# Patient Record
Sex: Female | Born: 1935 | Race: Black or African American | Hispanic: No | Smoking: Never smoker
Health system: Southern US, Community
[De-identification: ages and names within clinical notes are randomized; demographics above are authoritative.]

## PROBLEM LIST (undated history)

## (undated) DIAGNOSIS — E559 Vitamin D deficiency, unspecified: Secondary | ICD-10-CM

## (undated) DIAGNOSIS — K219 Gastro-esophageal reflux disease without esophagitis: Secondary | ICD-10-CM

## (undated) DIAGNOSIS — R51 Headache: Secondary | ICD-10-CM

## (undated) DIAGNOSIS — E876 Hypokalemia: Secondary | ICD-10-CM

## (undated) DIAGNOSIS — G301 Alzheimer's disease with late onset: Secondary | ICD-10-CM

## (undated) DIAGNOSIS — R519 Headache, unspecified: Secondary | ICD-10-CM

## (undated) DIAGNOSIS — C259 Malignant neoplasm of pancreas, unspecified: Principal | ICD-10-CM

## (undated) DIAGNOSIS — E785 Hyperlipidemia, unspecified: Secondary | ICD-10-CM

## (undated) DIAGNOSIS — F0281 Dementia in other diseases classified elsewhere with behavioral disturbance: Secondary | ICD-10-CM

## (undated) DIAGNOSIS — R413 Other amnesia: Secondary | ICD-10-CM

## (undated) DIAGNOSIS — C772 Secondary and unspecified malignant neoplasm of intra-abdominal lymph nodes: Principal | ICD-10-CM

## (undated) DIAGNOSIS — I10 Essential (primary) hypertension: Secondary | ICD-10-CM

## (undated) HISTORY — DX: Hyperlipidemia, unspecified: E78.5

## (undated) HISTORY — DX: Dementia in other diseases classified elsewhere with behavioral disturbance: F02.81

## (undated) HISTORY — DX: Headache, unspecified: R51.9

## (undated) HISTORY — DX: Headache: R51

## (undated) HISTORY — DX: Gastro-esophageal reflux disease without esophagitis: K21.9

## (undated) HISTORY — PX: APPENDECTOMY: SHX54

## (undated) HISTORY — DX: Other amnesia: R41.3

## (undated) HISTORY — DX: Vitamin D deficiency, unspecified: E55.9

## (undated) HISTORY — DX: Malignant neoplasm of pancreas, unspecified: C25.9

## (undated) HISTORY — DX: Alzheimer's disease with late onset: G30.1

## (undated) HISTORY — DX: Essential (primary) hypertension: I10

## (undated) HISTORY — DX: Hypokalemia: E87.6

## (undated) HISTORY — DX: Secondary and unspecified malignant neoplasm of intra-abdominal lymph nodes: C77.2

---

## 2013-01-25 ENCOUNTER — Telehealth: Payer: Self-pay

## 2013-01-25 NOTE — Telephone Encounter (Signed)
Patient's daughter states that she will bring all the information tomorrow. Has med records from previous provider. Needs DPR for daughter

## 2013-01-26 ENCOUNTER — Encounter: Payer: Self-pay | Admitting: Family Medicine

## 2013-01-26 ENCOUNTER — Ambulatory Visit: Payer: Self-pay | Admitting: Family Medicine

## 2013-01-26 ENCOUNTER — Ambulatory Visit (INDEPENDENT_AMBULATORY_CARE_PROVIDER_SITE_OTHER): Payer: Medicare Other | Admitting: Family Medicine

## 2013-01-26 VITALS — BP 122/60 | HR 74 | Temp 99.0°F | Ht 61.0 in | Wt 137.2 lb

## 2013-01-26 DIAGNOSIS — Z0000 Encounter for general adult medical examination without abnormal findings: Secondary | ICD-10-CM

## 2013-01-26 DIAGNOSIS — E785 Hyperlipidemia, unspecified: Secondary | ICD-10-CM

## 2013-01-26 DIAGNOSIS — R413 Other amnesia: Secondary | ICD-10-CM

## 2013-01-26 DIAGNOSIS — Z23 Encounter for immunization: Secondary | ICD-10-CM

## 2013-01-26 DIAGNOSIS — I10 Essential (primary) hypertension: Secondary | ICD-10-CM

## 2013-01-26 MED ORDER — ZOSTER VACCINE LIVE 19400 UNT/0.65ML ~~LOC~~ SOLR: 0.6500 mL | Freq: Once | SUBCUTANEOUS | Status: DC

## 2013-01-26 NOTE — Patient Instructions (Signed)
Preventive Care for Adults, Female A healthy lifestyle and preventive care can promote health and wellness. Preventive health guidelines for women include the following key practices.  A routine yearly physical is a good way to check with your caregiver about your health and preventive screening. It is a chance to share any concerns and updates on your health, and to receive a thorough exam.  Visit your dentist for a routine exam and preventive care every 6 months. Brush your teeth twice a day and floss once a day. Good oral hygiene prevents tooth decay and gum disease.  The frequency of eye exams is based on your age, health, family medical history, use of contact lenses, and other factors. Follow your caregiver's recommendations for frequency of eye exams.  Eat a healthy diet. Foods like vegetables, fruits, whole grains, low-fat dairy products, and lean protein foods contain the nutrients you need without too many calories. Decrease your intake of foods high in solid fats, added sugars, and salt. Eat the right amount of calories for you.Get information about a proper diet from your caregiver, if necessary.  Regular physical exercise is one of the most important things you can do for your health. Most adults should get at least 150 minutes of moderate-intensity exercise (any activity that increases your heart rate and causes you to sweat) each week. In addition, most adults need muscle-strengthening exercises on 2 or more days a week.  Maintain a healthy weight. The body mass index (BMI) is a screening tool to identify possible weight problems. It provides an estimate of body fat based on height and weight. Your caregiver can help determine your BMI, and can help you achieve or maintain a healthy weight.For adults 20 years and older:  A BMI below 18.5 is considered underweight.  A BMI of 18.5 to 24.9 is normal.  A BMI of 25 to 29.9 is considered overweight.  A BMI of 30 and above is  considered obese.  Maintain normal blood lipids and cholesterol levels by exercising and minimizing your intake of saturated fat. Eat a balanced diet with plenty of fruit and vegetables. Blood tests for lipids and cholesterol should begin at age 20 and be repeated every 5 years. If your lipid or cholesterol levels are high, you are over 50, or you are at high risk for heart disease, you may need your cholesterol levels checked more frequently.Ongoing high lipid and cholesterol levels should be treated with medicines if diet and exercise are not effective.  If you smoke, find out from your caregiver how to quit. If you do not use tobacco, do not start.  If you are pregnant, do not drink alcohol. If you are breastfeeding, be very cautious about drinking alcohol. If you are not pregnant and choose to drink alcohol, do not exceed 1 drink per day. One drink is considered to be 12 ounces (355 mL) of beer, 5 ounces (148 mL) of wine, or 1.5 ounces (44 mL) of liquor.  Avoid use of street drugs. Do not share needles with anyone. Ask for help if you need support or instructions about stopping the use of drugs.  High blood pressure causes heart disease and increases the risk of stroke. Your blood pressure should be checked at least every 1 to 2 years. Ongoing high blood pressure should be treated with medicines if weight loss and exercise are not effective.  If you are 55 to 77 years old, ask your caregiver if you should take aspirin to prevent strokes.  Diabetes   screening involves taking a blood sample to check your fasting blood sugar level. This should be done once every 3 years, after age 45, if you are within normal weight and without risk factors for diabetes. Testing should be considered at a younger age or be carried out more frequently if you are overweight and have at least 1 risk factor for diabetes.  Breast cancer screening is essential preventive care for women. You should practice "breast  self-awareness." This means understanding the normal appearance and feel of your breasts and may include breast self-examination. Any changes detected, no matter how small, should be reported to a caregiver. Women in their 20s and 30s should have a clinical breast exam (CBE) by a caregiver as part of a regular health exam every 1 to 3 years. After age 40, women should have a CBE every year. Starting at age 40, women should consider having a mammography (breast X-ray test) every year. Women who have a family history of breast cancer should talk to their caregiver about genetic screening. Women at a high risk of breast cancer should talk to their caregivers about having magnetic resonance imaging (MRI) and a mammography every year.  The Pap test is a screening test for cervical cancer. A Pap test can show cell changes on the cervix that might become cervical cancer if left untreated. A Pap test is a procedure in which cells are obtained and examined from the lower end of the uterus (cervix).  Women should have a Pap test starting at age 21.  Between ages 21 and 29, Pap tests should be repeated every 2 years.  Beginning at age 30, you should have a Pap test every 3 years as long as the past 3 Pap tests have been normal.  Some women have medical problems that increase the chance of getting cervical cancer. Talk to your caregiver about these problems. It is especially important to talk to your caregiver if a new problem develops soon after your last Pap test. In these cases, your caregiver may recommend more frequent screening and Pap tests.  The above recommendations are the same for women who have or have not gotten the vaccine for human papillomavirus (HPV).  If you had a hysterectomy for a problem that was not cancer or a condition that could lead to cancer, then you no longer need Pap tests. Even if you no longer need a Pap test, a regular exam is a good idea to make sure no other problems are  starting.  If you are between ages 65 and 70, and you have had normal Pap tests going back 10 years, you no longer need Pap tests. Even if you no longer need a Pap test, a regular exam is a good idea to make sure no other problems are starting.  If you have had past treatment for cervical cancer or a condition that could lead to cancer, you need Pap tests and screening for cancer for at least 20 years after your treatment.  If Pap tests have been discontinued, risk factors (such as a new sexual partner) need to be reassessed to determine if screening should be resumed.  The HPV test is an additional test that may be used for cervical cancer screening. The HPV test looks for the virus that can cause the cell changes on the cervix. The cells collected during the Pap test can be tested for HPV. The HPV test could be used to screen women aged 30 years and older, and should   be used in women of any age who have unclear Pap test results. After the age of 30, women should have HPV testing at the same frequency as a Pap test.  Colorectal cancer can be detected and often prevented. Most routine colorectal cancer screening begins at the age of 50 and continues through age 75. However, your caregiver may recommend screening at an earlier age if you have risk factors for colon cancer. On a yearly basis, your caregiver may provide home test kits to check for hidden blood in the stool. Use of a small camera at the end of a tube, to directly examine the colon (sigmoidoscopy or colonoscopy), can detect the earliest forms of colorectal cancer. Talk to your caregiver about this at age 50, when routine screening begins. Direct examination of the colon should be repeated every 5 to 10 years through age 75, unless early forms of pre-cancerous polyps or small growths are found.  Hepatitis C blood testing is recommended for all people born from 1945 through 1965 and any individual with known risks for hepatitis C.  Practice  safe sex. Use condoms and avoid high-risk sexual practices to reduce the spread of sexually transmitted infections (STIs). STIs include gonorrhea, chlamydia, syphilis, trichomonas, herpes, HPV, and human immunodeficiency virus (HIV). Herpes, HIV, and HPV are viral illnesses that have no cure. They can result in disability, cancer, and death. Sexually active women aged 25 and younger should be checked for chlamydia. Older women with new or multiple partners should also be tested for chlamydia. Testing for other STIs is recommended if you are sexually active and at increased risk.  Osteoporosis is a disease in which the bones lose minerals and strength with aging. This can result in serious bone fractures. The risk of osteoporosis can be identified using a bone density scan. Women ages 65 and over and women at risk for fractures or osteoporosis should discuss screening with their caregivers. Ask your caregiver whether you should take a calcium supplement or vitamin D to reduce the rate of osteoporosis.  Menopause can be associated with physical symptoms and risks. Hormone replacement therapy is available to decrease symptoms and risks. You should talk to your caregiver about whether hormone replacement therapy is right for you.  Use sunscreen with sun protection factor (SPF) of 30 or more. Apply sunscreen liberally and repeatedly throughout the day. You should seek shade when your shadow is shorter than you. Protect yourself by wearing long sleeves, pants, a wide-brimmed hat, and sunglasses year round, whenever you are outdoors.  Once a month, do a whole body skin exam, using a mirror to look at the skin on your back. Notify your caregiver of new moles, moles that have irregular borders, moles that are larger than a pencil eraser, or moles that have changed in shape or color.  Stay current with required immunizations.  Influenza. You need a dose every fall (or winter). The composition of the flu vaccine  changes each year, so being vaccinated once is not enough.  Pneumococcal polysaccharide. You need 1 to 2 doses if you smoke cigarettes or if you have certain chronic medical conditions. You need 1 dose at age 65 (or older) if you have never been vaccinated.  Tetanus, diphtheria, pertussis (Tdap, Td). Get 1 dose of Tdap vaccine if you are younger than age 65, are over 65 and have contact with an infant, are a healthcare worker, are pregnant, or simply want to be protected from whooping cough. After that, you need a Td   booster dose every 10 years. Consult your caregiver if you have not had at least 3 tetanus and diphtheria-containing shots sometime in your life or have a deep or dirty wound.  HPV. You need this vaccine if you are a woman age 26 or younger. The vaccine is given in 3 doses over 6 months.  Measles, mumps, rubella (MMR). You need at least 1 dose of MMR if you were born in 1957 or later. You may also need a second dose.  Meningococcal. If you are age 19 to 21 and a first-year college student living in a residence hall, or have one of several medical conditions, you need to get vaccinated against meningococcal disease. You may also need additional booster doses.  Zoster (shingles). If you are age 60 or older, you should get this vaccine.  Varicella (chickenpox). If you have never had chickenpox or you were vaccinated but received only 1 dose, talk to your caregiver to find out if you need this vaccine.  Hepatitis A. You need this vaccine if you have a specific risk factor for hepatitis A virus infection or you simply wish to be protected from this disease. The vaccine is usually given as 2 doses, 6 to 18 months apart.  Hepatitis B. You need this vaccine if you have a specific risk factor for hepatitis B virus infection or you simply wish to be protected from this disease. The vaccine is given in 3 doses, usually over 6 months. Preventive Services / Frequency Ages 19 to 39  Blood  pressure check.** / Every 1 to 2 years.  Lipid and cholesterol check.** / Every 5 years beginning at age 20.  Clinical breast exam.** / Every 3 years for women in their 20s and 30s.  Pap test.** / Every 2 years from ages 21 through 29. Every 3 years starting at age 30 through age 65 or 70 with a history of 3 consecutive normal Pap tests.  HPV screening.** / Every 3 years from ages 30 through ages 65 to 70 with a history of 3 consecutive normal Pap tests.  Hepatitis C blood test.** / For any individual with known risks for hepatitis C.  Skin self-exam. / Monthly.  Influenza immunization.** / Every year.  Pneumococcal polysaccharide immunization.** / 1 to 2 doses if you smoke cigarettes or if you have certain chronic medical conditions.  Tetanus, diphtheria, pertussis (Tdap, Td) immunization. / A one-time dose of Tdap vaccine. After that, you need a Td booster dose every 10 years.  HPV immunization. / 3 doses over 6 months, if you are 26 and younger.  Measles, mumps, rubella (MMR) immunization. / You need at least 1 dose of MMR if you were born in 1957 or later. You may also need a second dose.  Meningococcal immunization. / 1 dose if you are age 19 to 21 and a first-year college student living in a residence hall, or have one of several medical conditions, you need to get vaccinated against meningococcal disease. You may also need additional booster doses.  Varicella immunization.** / Consult your caregiver.  Hepatitis A immunization.** / Consult your caregiver. 2 doses, 6 to 18 months apart.  Hepatitis B immunization.** / Consult your caregiver. 3 doses usually over 6 months. Ages 40 to 64  Blood pressure check.** / Every 1 to 2 years.  Lipid and cholesterol check.** / Every 5 years beginning at age 20.  Clinical breast exam.** / Every year after age 40.  Mammogram.** / Every year beginning at age 40   and continuing for as long as you are in good health. Consult with your  caregiver.  Pap test.** / Every 3 years starting at age 30 through age 65 or 70 with a history of 3 consecutive normal Pap tests.  HPV screening.** / Every 3 years from ages 30 through ages 65 to 70 with a history of 3 consecutive normal Pap tests.  Fecal occult blood test (FOBT) of stool. / Every year beginning at age 50 and continuing until age 75. You may not need to do this test if you get a colonoscopy every 10 years.  Flexible sigmoidoscopy or colonoscopy.** / Every 5 years for a flexible sigmoidoscopy or every 10 years for a colonoscopy beginning at age 50 and continuing until age 75.  Hepatitis C blood test.** / For all people born from 1945 through 1965 and any individual with known risks for hepatitis C.  Skin self-exam. / Monthly.  Influenza immunization.** / Every year.  Pneumococcal polysaccharide immunization.** / 1 to 2 doses if you smoke cigarettes or if you have certain chronic medical conditions.  Tetanus, diphtheria, pertussis (Tdap, Td) immunization.** / A one-time dose of Tdap vaccine. After that, you need a Td booster dose every 10 years.  Measles, mumps, rubella (MMR) immunization. / You need at least 1 dose of MMR if you were born in 1957 or later. You may also need a second dose.  Varicella immunization.** / Consult your caregiver.  Meningococcal immunization.** / Consult your caregiver.  Hepatitis A immunization.** / Consult your caregiver. 2 doses, 6 to 18 months apart.  Hepatitis B immunization.** / Consult your caregiver. 3 doses, usually over 6 months. Ages 65 and over  Blood pressure check.** / Every 1 to 2 years.  Lipid and cholesterol check.** / Every 5 years beginning at age 20.  Clinical breast exam.** / Every year after age 40.  Mammogram.** / Every year beginning at age 40 and continuing for as long as you are in good health. Consult with your caregiver.  Pap test.** / Every 3 years starting at age 30 through age 65 or 70 with a 3  consecutive normal Pap tests. Testing can be stopped between 65 and 70 with 3 consecutive normal Pap tests and no abnormal Pap or HPV tests in the past 10 years.  HPV screening.** / Every 3 years from ages 30 through ages 65 or 70 with a history of 3 consecutive normal Pap tests. Testing can be stopped between 65 and 70 with 3 consecutive normal Pap tests and no abnormal Pap or HPV tests in the past 10 years.  Fecal occult blood test (FOBT) of stool. / Every year beginning at age 50 and continuing until age 75. You may not need to do this test if you get a colonoscopy every 10 years.  Flexible sigmoidoscopy or colonoscopy.** / Every 5 years for a flexible sigmoidoscopy or every 10 years for a colonoscopy beginning at age 50 and continuing until age 75.  Hepatitis C blood test.** / For all people born from 1945 through 1965 and any individual with known risks for hepatitis C.  Osteoporosis screening.** / A one-time screening for women ages 65 and over and women at risk for fractures or osteoporosis.  Skin self-exam. / Monthly.  Influenza immunization.** / Every year.  Pneumococcal polysaccharide immunization.** / 1 dose at age 65 (or older) if you have never been vaccinated.  Tetanus, diphtheria, pertussis (Tdap, Td) immunization. / A one-time dose of Tdap vaccine if you are over   65 and have contact with an infant, are a healthcare worker, or simply want to be protected from whooping cough. After that, you need a Td booster dose every 10 years.  Varicella immunization.** / Consult your caregiver.  Meningococcal immunization.** / Consult your caregiver.  Hepatitis A immunization.** / Consult your caregiver. 2 doses, 6 to 18 months apart.  Hepatitis B immunization.** / Check with your caregiver. 3 doses, usually over 6 months. ** Family history and personal history of risk and conditions may change your caregiver's recommendations. Document Released: 06/02/2001 Document Revised: 06/29/2011  Document Reviewed: 09/01/2010 [employer] Patient Information 2014 [employer]  

## 2013-01-26 NOTE — Progress Notes (Signed)
Subjective:    Michelle Noble is a 79 y.o. female who presents for Medicare Annual/Subsequent preventive examination.  Preventive Screening-Counseling & Management  Tobacco History  Smoking status  . Not on file  Smokeless tobacco  . Not on file     Problems Prior to Visit 1. none  Current Problems (verified) There are no active problems to display for this patient.   Medications Prior to Visit No current outpatient prescriptions on file prior to visit.   No current facility-administered medications on file prior to visit.    Current Medications (verified) Current Outpatient Prescriptions  Medication Sig Dispense Refill  . aspirin 81 MG tablet Take 81 mg by mouth daily.      Marland Kitchen atorvastatin (LIPITOR) 20 MG tablet Take 20 mg by mouth daily.      Marland Kitchen azelastine (ASTELIN) 137 MCG/SPRAY nasal spray Place 1 spray into the nose 2 (two) times daily. Use in each nostril as directed      . cetirizine (ZYRTEC) 10 MG tablet Take 10 mg by mouth daily.      . Cholecalciferol (VITAMIN D) 2000 UNITS CAPS Take by mouth.      . furosemide (LASIX) 20 MG tablet Take 20 mg by mouth.      . metoprolol succinate (TOPROL-XL) 100 MG 24 hr tablet Take 50 mg by mouth daily. Take with or immediately following a meal.      . mirtazapine (REMERON) 15 MG tablet Take 30 mg by mouth at bedtime.      . potassium chloride (MICRO-K) 10 MEQ CR capsule Take 10 mEq by mouth 2 (two) times daily.      . valsartan (DIOVAN) 320 MG tablet Take 320 mg by mouth daily.       No current facility-administered medications for this visit.     Allergies (verified) Review of patient's allergies indicates no known allergies.   PAST HISTORY  Family History Family History  Problem Relation Age of Onset  . Arthritis Maternal Grandmother     Maternal family  . Arthritis Mother     Social History History  Substance Use Topics  . Smoking status: Not on file  . Smokeless tobacco: Not on file  . Alcohol Use: Not on  file     Are there smokers in your home (other than you)? No  Risk Factors Current exercise habits: The patient does not participate in regular exercise at present.  Dietary issues discussed: na   Cardiac risk factors: advanced age (older than 8 for men, 60 for women), dyslipidemia, hypertension and sedentary lifestyle.  Depression Screen (Note: if answer to either of the following is "Yes", a more complete depression screening is indicated)   Over the past two weeks, have you felt down, depressed or hopeless? Yes  Over the past two weeks, have you felt little interest or pleasure in doing things? No  Have you lost interest or pleasure in daily life? No  Do you often feel hopeless? No  Do you cry easily over simple problems? No  Activities of Daily Living In your present state of health, do you have any difficulty performing the following activities?:  Driving? Yes Managing money?  Yes Feeding yourself? No Getting from bed to chair? No Climbing a flight of stairs? No Preparing food and eating?: No Bathing or showering? Yes Getting dressed: No Getting to the toilet? No Using the toilet:No Moving around from place to place: No In the past year have you fallen or had a near  fall?:No   Are you sexually active?  No  Do you have more than one partner?  No  Hearing Difficulties: No Do you often ask people to speak up or repeat themselves? No Do you experience ringing or noises in your ears? No Do you have difficulty understanding soft or whispered voices? No   Do you feel that you have a problem with memory? Yes  Do you often misplace items? Yes  Do you feel safe at home?  Yes  Cognitive Testing  Alert? Yes  Normal Appearance?Yes  Oriented to person? Yes  Place? No   Time? Yes  Recall of three objects?  Yes  Can perform simple calculations? Yes  Displays appropriate judgment?Yes  Can read the correct time from a watch face?Yes   Advanced Directives have been discussed  with the patient? Yes  List the Names of Other Physician/Practitioners you currently use: 1.  Neuro- [place] 2--ortho--[facility] 3. Dentist- [facility] Indicate any recent Medical Services you may have received from other than [facility] providers in the past year (date may be approximate).  Immunization History  Administered Date(s) Administered  . Pneumococcal Conjugate 06/03/2011    Screening Tests Health Maintenance  Topic Date Due  . Tetanus/tdap  02/03/1941  . Colonoscopy  02/04/1972  . Zostavax  02/03/1982  . Influenza Vaccine  11/18/2012  . Pneumococcal Polysaccharide Vaccine Age 44 And Over  Completed    All answers were reviewed with the patient and necessary referrals were made:  Loreen Freud, DO   01/26/2013   History reviewed:  She  has a past medical history of Frequent headaches; High blood pressure; Hyperlipidemia; Vitamin D deficiency; Hypokalemia; and GERD (gastroesophageal reflux disease). She  does not have a problem list on file. She  has past surgical history that includes Appendectomy. Her family history includes Arthritis in her maternal grandmother and mother. She  has no tobacco, alcohol, and drug history on file. She has a current medication list which includes the following prescription(s): aspirin, atorvastatin, azelastine, cetirizine, vitamin d, furosemide, metoprolol succinate, mirtazapine, potassium chloride, and valsartan. No current outpatient prescriptions on file prior to visit.   No current facility-administered medications on file prior to visit.   She has No Known Allergies.  Review of Systems  Review of Systems  Constitutional: Negative for activity change, appetite change and fatigue.  HENT: Negative for hearing loss, congestion, tinnitus and ear discharge.   Eyes: Negative for visual disturbance (see optho q1y -- vision corrected to 20/20 with glasses).  Respiratory: Negative for cough, chest tightness and shortness of breath.   Cardiovascular:  Negative for chest pain, palpitations and leg swelling.  Gastrointestinal: Negative for abdominal pain, diarrhea, constipation and abdominal distention.  Genitourinary: Negative for urgency, frequency, decreased urine volume and difficulty urinating.  Musculoskeletal: Negative for back pain, arthralgias and gait problem.  Skin: Negative for color change, pallor and rash.  Neurological: Negative for dizziness, light-headedness, numbness and headaches.  Hematological: Negative for adenopathy. Does not bruise/bleed easily.  Psychiatric/Behavioral: Negative for suicidal ideas, confusion, sleep disturbance, self-injury, dysphoric mood, decreased concentration and agitation.  Pt is able to read and write and can do all ADLs No risk for falling No abuse/ violence in home     Objective:     Vision by Snellen chart: oph  Body mass index is 25.94 kg/(m^2). BP 122/60  Pulse 74  Temp(Src) 99 F (37.2 C) (Oral)  Ht 5\' 1"  (1.549 m)  Wt 137 lb 3.2 oz (62.234 kg)  BMI 25.94 kg/m2  SpO2 98%  BP 122/60  Pulse 74  Temp(Src) 99 F (37.2 C) (Oral)  Ht 5\' 1"  (1.549 m)  Wt 137 lb 3.2 oz (62.234 kg)  BMI 25.94 kg/m2  SpO2 98% General appearance: alert, cooperative, appears stated age and no distress Head: Normocephalic, without obvious abnormality, atraumatic Eyes: conjunctivae/corneas clear. PERRL, EOM's intact. Fundi benign. Ears: normal TM's and external ear canals both ears Nose: Nares normal. Septum midline. Mucosa normal. No drainage or sinus tenderness. Throat: lips, mucosa, and tongue normal; teeth and gums normal Neck: no adenopathy, no carotid bruit, no JVD, supple, symmetrical, trachea midline and thyroid not enlarged, symmetric, no tenderness/mass/nodules Back: symmetric, no curvature. ROM normal. No CVA tenderness. Lungs: clear to auscultation bilaterally Breasts: normal appearance, no masses or tenderness Heart: S1, S2 normal Abdomen: soft, non-tender; bowel sounds normal; no  masses,  no organomegaly Pelvic: not indicated; post-menopausal, no abnormal Pap smears in past Extremities: extremities normal, atraumatic, no cyanosis or edema Pulses: 2+ and symmetric Skin: Skin color, texture, turgor normal. No rashes or lesions Lymph nodes: Cervical, supraclavicular, and axillary nodes normal. Neurologic:pt answered ? Correctly but has some memory loss per daugher--- ov notes from neuro in [place] reviewed     Assessment:     cpe      Plan:     During the course of the visit the patient was educated and counseled about appropriate screening and preventive services including:    Pneumococcal vaccine   Influenza vaccine  Screening mammography  Bone densitometry screening  Colorectal cancer screening  Diabetes screening  Glaucoma screening  Advanced directives: has NO advanced directive - not interested in additional information Pt refusing mammogram and bmd Diet review for nutrition referral? Yes ____  Not Indicated ___x_   Patient Instructions (the written plan) was given to the patient.  Medicare Attestation I have personally reviewed: The patient's medical and social history Their use of alcohol, tobacco or illicit drugs Their current medications and supplements The patient's functional ability including ADLs,fall risks, home safety risks, cognitive, and hearing and visual impairment Diet and physical activities Evidence for depression or mood disorders  The patient's weight, height, BMI, and visual acuity have been recorded in the chart.  I have made referrals, counseling, and provided education to the patient based on review of the above and I have provided the patient with a written personalized care plan for preventive services.     Loreen Freud, DO   01/26/2013

## 2013-01-31 ENCOUNTER — Telehealth: Payer: Self-pay | Admitting: *Deleted

## 2013-01-31 ENCOUNTER — Other Ambulatory Visit (INDEPENDENT_AMBULATORY_CARE_PROVIDER_SITE_OTHER): Payer: Medicare Other

## 2013-01-31 DIAGNOSIS — E785 Hyperlipidemia, unspecified: Secondary | ICD-10-CM

## 2013-01-31 DIAGNOSIS — R413 Other amnesia: Secondary | ICD-10-CM

## 2013-01-31 DIAGNOSIS — I10 Essential (primary) hypertension: Secondary | ICD-10-CM

## 2013-01-31 LAB — HEPATIC FUNCTION PANEL
ALT: 15 U/L (ref 0–35)
Albumin: 3.7 g/dL (ref 3.5–5.2)
Alkaline Phosphatase: 60 U/L (ref 39–117)
Bilirubin, Direct: 0 mg/dL (ref 0.0–0.3)
Total Bilirubin: 0.6 mg/dL (ref 0.3–1.2)

## 2013-01-31 LAB — CBC WITH DIFFERENTIAL/PLATELET
Basophils Absolute: 0 10*3/uL (ref 0.0–0.1)
Basophils Relative: 0.2 % (ref 0.0–3.0)
Eosinophils Relative: 5.5 % — ABNORMAL HIGH (ref 0.0–5.0)
Lymphs Abs: 3.1 10*3/uL (ref 0.7–4.0)
MCV: 70.7 fl — ABNORMAL LOW (ref 78.0–100.0)
Monocytes Absolute: 0.5 10*3/uL (ref 0.1–1.0)
Monocytes Relative: 7.8 % (ref 3.0–12.0)
Neutrophils Relative %: 37.9 % — ABNORMAL LOW (ref 43.0–77.0)
Platelets: 181 10*3/uL (ref 150.0–400.0)
RBC: 5.17 Mil/uL — ABNORMAL HIGH (ref 3.87–5.11)
RDW: 15.7 % — ABNORMAL HIGH (ref 11.5–14.6)
WBC: 6.4 10*3/uL (ref 4.5–10.5)

## 2013-01-31 LAB — BASIC METABOLIC PANEL
BUN: 16 mg/dL (ref 6–23)
Calcium: 9 mg/dL (ref 8.4–10.5)
Creatinine, Ser: 1 mg/dL (ref 0.4–1.2)
GFR: 69.23 mL/min (ref >60.00)

## 2013-01-31 LAB — LIPID PANEL
Cholesterol: 161 mg/dL (ref 0–200)
LDL Cholesterol: 87 mg/dL (ref 0–99)
Total CHOL/HDL Ratio: 3
Triglycerides: 86 mg/dL (ref 0.0–149.0)
VLDL: 17.2 mg/dL (ref 0.0–40.0)

## 2013-01-31 NOTE — Telephone Encounter (Signed)
Patient presented to the office with daughter requesting information of after hours care assistance for her mother, printed information with senior resources provided.

## 2013-02-07 ENCOUNTER — Telehealth: Payer: Self-pay

## 2013-02-07 ENCOUNTER — Encounter: Payer: Self-pay | Admitting: Family Medicine

## 2013-02-07 DIAGNOSIS — F0390 Unspecified dementia without behavioral disturbance: Secondary | ICD-10-CM | POA: Insufficient documentation

## 2013-02-07 DIAGNOSIS — R413 Other amnesia: Secondary | ICD-10-CM | POA: Insufficient documentation

## 2013-02-07 NOTE — Telephone Encounter (Signed)
dementia
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
## 2013-02-07 NOTE — Telephone Encounter (Signed)
Memory loss--  And offer neurology referral as well

## 2013-02-20 ENCOUNTER — Ambulatory Visit (INDEPENDENT_AMBULATORY_CARE_PROVIDER_SITE_OTHER): Payer: Medicare Other | Admitting: Family Medicine

## 2013-02-20 ENCOUNTER — Encounter: Payer: Self-pay | Admitting: Family Medicine

## 2013-02-20 VITALS — BP 120/62 | HR 72 | Temp 98.6°F | Wt 138.2 lb

## 2013-02-20 DIAGNOSIS — R413 Other amnesia: Secondary | ICD-10-CM

## 2013-02-20 DIAGNOSIS — IMO0002 Reserved for concepts with insufficient information to code with codable children: Secondary | ICD-10-CM

## 2013-02-20 DIAGNOSIS — F0390 Unspecified dementia without behavioral disturbance: Secondary | ICD-10-CM

## 2013-02-20 DIAGNOSIS — G4700 Insomnia, unspecified: Secondary | ICD-10-CM

## 2013-02-20 DIAGNOSIS — R451 Restlessness and agitation: Secondary | ICD-10-CM

## 2013-02-20 MED ORDER — QUETIAPINE FUMARATE 50 MG PO TABS: 50.0000 mg | ORAL_TABLET | Freq: Every day | ORAL | Status: DC

## 2013-02-20 NOTE — Assessment & Plan Note (Signed)
Neuro app pending

## 2013-02-20 NOTE — Assessment & Plan Note (Addendum)
Seroquel per orders Keep neuro app
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
## 2013-02-20 NOTE — Patient Instructions (Signed)
Dementia Dementia is a general term for problems with brain function. A person with dementia has memory loss and a hard time with at least one other brain function such as thinking, speaking, or problem solving. Dementia can affect social functioning, how you do your job, your mood, or your personality. The changes may be hidden for a long time. The earliest forms of this disease are usually not detected by family or friends. Dementia can be:  Irreversible.  Potentially reversible.  Partially reversible.  Progressive. This means it can get worse over time. CAUSES  Irreversible dementia causes may include:  Degeneration of brain cells (Alzheimer's disease or lewy body dementia).  Multiple small strokes (vascular dementia).  Infection (chronic meningitis or Creutzfelt-Jakob disease).  Frontotemporal dementia. This affects younger people, age 40 to 70, compared to those who have Alzheimer's disease.  Dementia associated with other disorders like Parkinson's disease, Huntington's disease, or HIV-associated dementia. Potentially or partially reversible dementia causes may include:  Medicines.  Metabolic causes such as excessive alcohol intake, vitamin B12 deficiency, or thyroid disease.  Masses or pressure in the brain such as a tumor, blood clot, or hydrocephalus. SYMPTOMS  Symptoms are often hard to detect. Family members or coworkers may not notice them early in the disease process. Different people with dementia may have different symptoms. Symptoms can include:  A hard time with memory, especially recent memory. Long-term memory may not be impaired.  Asking the same question multiple times or forgetting something someone just said.  A hard time speaking your thoughts or finding certain words.  A hard time solving problems or performing familiar tasks (such as how to use a telephone).  Sudden changes in mood.  Changes in personality, especially increasing moodiness or  mistrust.  Depression.  A hard time understanding complex ideas that were never a problem in the past. DIAGNOSIS  There are no specific tests for dementia.   Your caregiver may recommend a thorough evaluation. This is because some forms of dementia can be reversible. The evaluation will likely include a physical exam and getting a detailed history from you and a family member. The history often gives the best clues and suggestions for a diagnosis.  Memory testing may be done. A detailed brain function evaluation called neuropsychologic testing may be helpful.  Lab tests and brain imaging (such as a CT scan or MRI scan) are sometimes important.  Sometimes observation and re-evaluation over time is very helpful. TREATMENT  Treatment depends on the cause.   If the problem is a vitamin deficiency, it may be helped or cured with supplements.  For dementias such as Alzheimer's disease, medicines are available to stabilize or slow the course of the disease. There are no cures for this type of dementia.  Your caregiver can help direct you to groups, organizations, and other caregivers to help with decisions in the care of you or your loved one. HOME CARE INSTRUCTIONS The care of individuals with dementia is varied and dependent upon the progression of the dementia. The following suggestions are intended for the person living with, or caring for, the person with dementia.  Create a safe environment.  Remove the locks on bathroom doors to prevent the person from accidentally locking himself or herself in.  Use childproof latches on kitchen cabinets and any place where cleaning supplies, chemicals, or alcohol are kept.  Use childproof covers in unused electrical outlets.  Install childproof devices to keep doors and windows secured.  Remove stove knobs or install safety   knobs and an automatic shut-off on the stove.  Lower the temperature on water heaters.  Label medicines and keep them  locked up.  Secure knives, lighters, matches, power tools, and guns, and keep these items out of reach.  Keep the house free from clutter. Remove rugs or anything that might contribute to a fall.  Remove objects that might break and hurt the person.  Make sure lighting is good, both inside and outside.  Install grab rails as needed.  Use a monitoring device to alert you to falls or other needs for help.  Reduce confusion.  Keep familiar objects and people around.  Use night lights or dim lights at night.  Label items or areas.  Use reminders, notes, or directions for daily activities or tasks.  Keep a simple, consistent routine for waking, meals, bathing, dressing, and bedtime.  Create a calm, quiet environment.  Place large clocks and calendars prominently.  Display emergency numbers and home address near all telephones.  Use cues to establish different times of the day. An example is to open curtains to let the natural light in during the day.   Use effective communication.  Choose simple words and short sentences.  Use a gentle, calm tone of voice.  Be careful not to interrupt.  If the person is struggling to find a word or communicate a thought, try to provide the word or thought.  Ask one question at a time. Allow the person ample time to answer questions. Repeat the question again if the person does not respond.  Reduce nighttime restlessness.  Provide a comfortable bed.  Have a consistent nighttime routine.  Ensure a regular walking or physical activity schedule. Involve the person in daily activities as much as possible.  Limit napping during the day.  Limit caffeine.  Attend social events that stimulate rather than overwhelm the senses.  Encourage good nutrition and hydration.  Reduce distractions during meal times and snacks.  Avoid foods that are too hot or too cold.  Monitor chewing and swallowing ability.  Continue with routine vision,  hearing, dental, and medical screenings.  Only give over-the-counter or prescription medicines as directed by the caregiver.  Monitor driving abilities. Do not allow the person to drive when safe driving is no longer possible.  Register with an identification program which could provide location assistance in the event of a missing person situation. SEEK MEDICAL CARE IF:   New behavioral problems start such as moodiness, aggressiveness, or seeing things that are not there (hallucinations).  Any new problem with brain function happens. This includes problems with balance, speech, or falling a lot.  Problems with swallowing develop.  Any symptoms of other illness happen. Small changes or worsening in any aspect of brain function can be a sign that the illness is getting worse. It can also be a sign of another medical illness such as infection. Seeing a caregiver right away is important. SEEK IMMEDIATE MEDICAL CARE IF:   A fever develops.  New or worsened confusion develops.  New or worsened sleepiness develops.  Staying awake becomes hard to do. Document Released: 09/30/2000 Document Revised: 06/29/2011 Document Reviewed: 09/01/2010 [employer] Patient Information 2014 [employer].  

## 2013-02-22 ENCOUNTER — Encounter: Payer: Self-pay | Admitting: Neurology

## 2013-02-22 ENCOUNTER — Ambulatory Visit (INDEPENDENT_AMBULATORY_CARE_PROVIDER_SITE_OTHER): Payer: Medicare Other | Admitting: Neurology

## 2013-02-22 VITALS — BP 144/80 | HR 62 | Temp 98.2°F | Ht 60.0 in | Wt 136.0 lb

## 2013-02-22 DIAGNOSIS — F0280 Dementia in other diseases classified elsewhere without behavioral disturbance: Secondary | ICD-10-CM

## 2013-02-22 MED ORDER — MEMANTINE HCL 5 MG PO TABS: ORAL_TABLET | ORAL | Status: DC

## 2013-02-22 NOTE — Progress Notes (Signed)
NEUROLOGY CONSULTATION NOTE  Michelle Noble MRN: [id number] DOB: 06-Dec-1919  Referring provider: Dr. Laury Axon Primary care provider: Dr. Laury Axon  Reason for consult:  Dementia  HISTORY OF PRESENT ILLNESS: Michelle Noble is a 79 year old woman with history of depression, HTN, hyperlipidemia who presents for memory problems.  She is accompanied by her daughter.  Records and images were personally reviewed where available.    Her daughter first noticed symptoms about 4 years ago.  She would be sitting with friends and family and later turn to them thinking that they just arrived there.  She began having trouble navigating while driving and would get lost, even on familiar routes.  She has left the stove on several times, setting off the fire alarm.  She would forget to fix meals but would not forget to eat however.  She is able to perform ADLs such as dressing and bathing.  She kept a lot of stuff in the house, such as papers and books, and her children were concerned of hoarding.  The house was not filthy, however.  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  She has not managed her own finances for some time.  Sometimes she would not recognize her daughter.  She would misplace money and accuse the home aids and housekeepers of stealing.  She had a neuropsychological evaluation in March 2014, which revealed global decline of functioning, involving executive functioning, confrontational naming, and memory.  She was diagnosed with a neurodegenerative mild cognitive impairment.  She was initially started on Aricept, which was stopped due to worsening behavior.  This was reported by the home aides but not witnessed by her daughter.  She recently moved in with her daughter.  Since the move, she still sometimes thinks she lives in her own place and would continuously start packing her clothes as if she is getting ready to go home.  This usually occurs in  the evening and may occasionally last into the night.  She is not agitated or combative.  She does not seem distressed and her daughter is not inconvenienced by this.  She was started on Seroquel this week to treat this.  She will sometimes see men in the house that aren't there.  While her daughter is at work, her niece cares for her in the house.  At this time, she does not feel depressed.  She is sleeping well.  She does get up a couple of times a night to go to the bathroom, but she falls right back to sleep and is well-rested in the morning.  She does not nap during the day.  She enjoys reading.  She no longer drives.  Once or twice a month, she will go stay with her other daughter.  01/31/13 TSH 2.11. 05/11/12 B12 613  She takes Remeron 30mg  qhs, which has helped the previous agitation.  She also takes Vit D 2000 IU.  She was just started on Seroquel 25mg  qhs.  PAST MEDICAL HISTORY: Past Medical History  Diagnosis Date  . Frequent headaches   . High blood pressure   . Hyperlipidemia   . Vitamin D deficiency   . Hypokalemia   . GERD (gastroesophageal reflux disease)   . Memory loss     PAST SURGICAL HISTORY: Past Surgical History  Procedure Laterality Date  . Appendectomy      MEDICATIONS: Current Outpatient Prescriptions on File Prior to Visit  Medication Sig  Dispense Refill  . aspirin 81 MG tablet Take 81 mg by mouth daily.      Marland Kitchen atorvastatin (LIPITOR) 20 MG tablet Take 20 mg by mouth daily.      Marland Kitchen azelastine (ASTELIN) 137 MCG/SPRAY nasal spray Place 1 spray into the nose 2 (two) times daily. Use in each nostril as directed      . cetirizine (ZYRTEC) 10 MG tablet Take 10 mg by mouth daily.      . Cholecalciferol (VITAMIN D) 2000 UNITS CAPS Take by mouth.      . furosemide (LASIX) 20 MG tablet Take 20 mg by mouth.      . metoprolol succinate (TOPROL-XL) 100 MG 24 hr tablet Take 50 mg by mouth daily. Take with or immediately following a meal.      . mirtazapine (REMERON) 15 MG  tablet Take 30 mg by mouth at bedtime.      . potassium chloride (MICRO-K) 10 MEQ CR capsule Take 10 mEq by mouth 2 (two) times daily.      . valsartan (DIOVAN) 320 MG tablet Take 320 mg by mouth daily.      Marland Kitchen zoster vaccine live, PF, (ZOSTAVAX) 96045 UNT/0.65ML injection Inject 19,400 Units into the skin once.  1 vial  0   No current facility-administered medications on file prior to visit.    ALLERGIES: No Known Allergies  FAMILY HISTORY: Family History  Problem Relation Age of Onset  . Arthritis Maternal Grandmother     Maternal family  . Arthritis Mother     SOCIAL HISTORY: History   Social History  . Marital Status: Widowed    Spouse Name: N/A    Number of Children: N/A  . Years of Education: N/A   Occupational History  . Not on file.   Social History Main Topics  . Smoking status: Never Smoker   . Smokeless tobacco: Former Neurosurgeon  . Alcohol Use: No  . Drug Use: No  . Sexual Activity: Not on file   Other Topics Concern  . Not on file   Social History Narrative  . No narrative on file    REVIEW OF SYSTEMS: Constitutional: No fevers, chills, or sweats, no generalized fatigue, change in appetite Eyes: No visual changes, double vision, eye pain Ear, nose and throat: No hearing loss, ear pain, nasal congestion, sore throat Cardiovascular: No chest pain, palpitations Respiratory:  No shortness of breath at rest or with exertion, wheezes GastrointestinaI: No nausea, vomiting, diarrhea, abdominal pain, fecal incontinence Genitourinary:  No dysuria, urinary retention or frequency Musculoskeletal:  No neck pain, back pain Integumentary: No rash, pruritus, skin lesions Neurological: as above Psychiatric: No depression, insomnia, anxiety Endocrine: No palpitations, fatigue, diaphoresis, mood swings, change in appetite, change in weight, increased thirst Hematologic/Lymphatic:  No anemia, purpura, petechiae. Allergic/Immunologic: no itchy/runny eyes, nasal congestion,  recent allergic reactions, rashes  PHYSICAL EXAM: Filed Vitals:   02/22/13 1232  BP: 144/80  Pulse: 62  Temp: 98.2 F (36.8 C)   General: No acute distress Head:  Normocephalic/atraumatic Neck: supple, no paraspinal tenderness, full range of motion Back: No paraspinal tenderness Heart: regular rate and rhythm Lungs: Clear to auscultation bilaterally. Vascular: No carotid bruits. Neurological Exam: Mental status: alert and oriented to self only (not place or time). speech fluent and not dysarthric.  Difficulty with naming and repeating.  Unable to correctly perform Trail Making Test, copying a cube or drawing a clock.  Unable to recall 5 out of 5 words after several minutes.  Impaired  attention and abstraction.  MOCA score 4/30. Cranial nerves: CN I: not tested CN II: pupils equal, round and reactive to light, visual fields intact, fundi not visualized. CN III, IV, VI:  full range of motion, no nystagmus, no ptosis CN V: facial sensation intact CN VII: upper and lower face symmetric CN VIII: hearing intact CN IX, X: gag intact, uvula midline CN XI: sternocleidomastoid and trapezius muscles intact CN XII: tongue midline Bulk & Tone: normal, no fasciculations. Motor: 5/5 throughout Sensation: temperature and vibration intact Deep Tendon Reflexes: 2+ throughout, except absent in ankles, toes down Finger to nose testing: no dysmetria Gait: normal stride, no ataxia. Romberg negative.  IMPRESSION: Probable Alzheimer's dementia.  She exhibits multi-domain cognitive impairment but primary and presenting symptom was memory.  PLAN: 1.  We will try Namenda, to stabilize disease progression and may help with behavior as well. 2.  I would discontinue Seroquel.  Her behavior does not seem to be disruptive to her or her daughter.  If nobody is bothered by it and if she is not a danger to herself, then I would let her be and not add another medication.  This may be confusion based on living  in a new environment.  Also, if needed, I would more likely consider Zyprexa. 3.  A routine schedule should be maintained.  It may be best that she not visit her other daughter during the month as this may exacerbate confusion. 4.  Follow up in 6 months or as needed.  We will send her information for support groups.  60 minutes spent with patient and daughter, over 50% spent counseling and coordinating care.  Thank you for allowing me to take part in the care of this patient.  Shon Millet, DO  CC:  Loreen Freud, DO

## 2013-02-22 NOTE — Patient Instructions (Addendum)
1.  Start Namenda 5mg  daily for one week, then 5mg  twice daily for one week, then 10mg  twice dailyAt that point, call the clinic and we can prescribe a larger single dose tablet of 28mg , to be taken daily.  Side effects include dizziness, headache, diarrhea or constipation.  Call with any questions or concerns.  Call for refill.  2.  Stop Seroquel  3.  Continue reading.  4.  Keep routine schedule.  5.  Follow up in 6 months or as needed.

## 2013-03-02 ENCOUNTER — Telehealth: Payer: Self-pay | Admitting: *Deleted

## 2013-03-02 NOTE — Telephone Encounter (Signed)
03/02/2013  Pt daughter, Alona Bene called this morning.  She states she spoke with Selena Batten about getting a referral for a home health nurse when her mother was here at her appt Monday, 02/20/2013.  Alona Bene states she was told the referral was put in Thursday 02/16/2013.  They still have not heard anything.  Please advise.  bw

## 2013-03-02 NOTE — Telephone Encounter (Signed)
Have looked in the patient's chart and couldn't find anything about home health.

## 2013-03-02 NOTE — Telephone Encounter (Signed)
Has this referral been handled? I could not find anything in the chart that indicated it was sent.

## 2013-03-03 NOTE — Telephone Encounter (Signed)
Discussed with daughter and the request has been sent to [facility] on the 21 st of Oct and again on the 30 th. I made her aware that she may need to call and I gave her the 800 number. Michelle Noble Medicaid card is still listed for another county and this may possible be the hold up. Michelle Noble voiced understanding and said she would call in on Monday and let me know what was said.     KP

## 2013-03-06 ENCOUNTER — Telehealth: Payer: Self-pay | Admitting: Family Medicine

## 2013-03-06 NOTE — Telephone Encounter (Signed)
Patient's daughter is calling requesting to speak with Selena Batten. She says that she called the agency where we had sent her referral for the home health services that her mother needs and was told that they had not offered those services in over a year. Patient's daughter says that they gave her a number to [facility] and was told that this company offers the services her mother needs.  She wanted to leave contact information Ph#: [phone]

## 2013-03-06 NOTE — Telephone Encounter (Signed)
Spoke with [facility] care and they will come out to see the patient on Wednesday. I made her daughter aware and gave them her call back number [phone]. Fax number [phone].     KP

## 2013-03-13 ENCOUNTER — Other Ambulatory Visit: Payer: Self-pay | Admitting: Neurology

## 2013-03-13 MED ORDER — MEMANTINE HCL 10 MG PO TABS: ORAL_TABLET | ORAL | Status: DC

## 2013-03-30 DIAGNOSIS — F0280 Dementia in other diseases classified elsewhere without behavioral disturbance: Secondary | ICD-10-CM

## 2013-03-30 DIAGNOSIS — R32 Unspecified urinary incontinence: Secondary | ICD-10-CM

## 2013-03-30 DIAGNOSIS — I10 Essential (primary) hypertension: Secondary | ICD-10-CM

## 2013-03-30 DIAGNOSIS — G309 Alzheimer's disease, unspecified: Secondary | ICD-10-CM

## 2013-03-30 DIAGNOSIS — F329 Major depressive disorder, single episode, unspecified: Secondary | ICD-10-CM

## 2013-04-03 ENCOUNTER — Telehealth: Payer: Self-pay | Admitting: *Deleted

## 2013-04-03 NOTE — Telephone Encounter (Signed)
04/03/2013  Home Health Certification and Plan of Treatment completed and faxed 03/31/2013.  Face to Face Encounter attached, but not completed. Attached billing form and returned to [employer].  bw

## 2013-04-04 NOTE — Telephone Encounter (Signed)
Patient daughter called and stated that her mother was denied personal care services from [facility]. Daughter wanted to know why and what is there next step because her mother really does need the assistances.

## 2013-04-05 NOTE — Telephone Encounter (Addendum)
The face to face for this patient is complete. MSG left for a return call.      KP

## 2013-04-06 NOTE — Telephone Encounter (Signed)
Spoke with patient and she stated that her mother was denied because she was medically unstable. [facility] and was told it was due to the check box being marked medically unstable.  She said I can check the other box and resend the info. I asked what makes the patient stable and unstable, she said whether or not the patient can do for themselves, I advised the patient can do for herself but she can not be left alone due to alzheimer's, daughter is concerned about leaving her alone. I was told the only way I could get her approved is by checking the medically stable box. I discussed with Dr.Lowne who advised to call the state to get clarification. Will call tomorrow.       KP

## 2013-04-06 NOTE — Telephone Encounter (Signed)
Patient's daughter is calling back to speak with Selena Batten. Please advise.

## 2013-04-11 NOTE — Telephone Encounter (Signed)
Patient's daughter called to follow up on this. She states the patient should be marked as "stable" because she can be left alone. Patient can do things by herself, but with supervision. She states patient cannot be left in the house alone for safety reasons. She states the patient is medically stable, but not psychologically stable.

## 2013-04-12 NOTE — Telephone Encounter (Signed)
04/12/2013  Sent to billing.  bw

## 2013-04-18 NOTE — Telephone Encounter (Signed)
Patient's daughter is calling to follow-up with Selena Batten on the status of her Mom receiving home health services. Please advise.

## 2013-04-21 NOTE — Telephone Encounter (Signed)
Information has been forwarded to [facility] to call; due to my unavailablily to wait on the phone for long hold times.      KP

## 2013-04-21 NOTE — Telephone Encounter (Signed)
Called and spoke with [place] office to clarify what "medically stable" entails in reference to home health care. Information given was as follows: if a patient is having constant seizures and need medical attention daily they are considered unstable. In the patient's current situation she is "medically" stable but needs assistance so that she doesn't harm herself in the course of the daily activities and assistance with amy state of confusion. Relayed information to PCP who changed form. New information faxed to [facility]    LM for daughter to return call.

## 2013-04-24 ENCOUNTER — Telehealth: Payer: Self-pay

## 2013-04-24 NOTE — Telephone Encounter (Signed)
Spoke with daughter (on [place]) and advised that new paperwork has been faxed to [facility] on Friday 04/21/2012

## 2013-04-25 NOTE — Telephone Encounter (Signed)
Spoke with daughter and she voiced understanding.     KP

## 2013-05-02 ENCOUNTER — Telehealth: Payer: Self-pay | Admitting: *Deleted

## 2013-05-02 NOTE — Telephone Encounter (Signed)
05/02/2013 Received Physician's Interim Order Report from [employer] by mail.  Attached billing form, put in [place] blue folder.  bw

## 2013-05-04 NOTE — Telephone Encounter (Signed)
Paperwork was faxed yesterday afternoon to [facility], detailed MSG left to make the patient aware.      KP

## 2013-05-04 NOTE — Telephone Encounter (Signed)
Patient's daughter Bethena Roys called stating home health came to see the patient, but with cognitive skill problems there is additional paperwork for them to get more hours. CB# [phone] call between 4:00-5:00pm

## 2013-05-12 ENCOUNTER — Other Ambulatory Visit: Payer: Self-pay

## 2013-05-12 MED ORDER — METOPROLOL SUCCINATE ER 100 MG PO TB24: 50.0000 mg | ORAL_TABLET | Freq: Every day | ORAL | Status: DC

## 2013-06-12 ENCOUNTER — Telehealth: Payer: Self-pay | Admitting: *Deleted

## 2013-06-12 NOTE — Telephone Encounter (Signed)
Patient daughter called and stated that kim was working on a referral for her mother to have a CNA come out to her mother's house. Patient's daughter states that her mother was approved for 76 hours the first time but now they granted her additional 50 hours but we needed to send another approval to [place].  Also patient does not know when to come back in for an office visit.

## 2013-06-12 NOTE — Telephone Encounter (Signed)
Completed form faxed to [facility].      KP

## 2013-06-22 ENCOUNTER — Other Ambulatory Visit: Payer: Self-pay | Admitting: *Deleted

## 2013-06-23 MED ORDER — FUROSEMIDE 20 MG PO TABS: 20.0000 mg | ORAL_TABLET | Freq: Every day | ORAL | Status: DC

## 2013-06-23 NOTE — Telephone Encounter (Signed)
Please advise refill for Lasix? Med never filled by you.

## 2013-07-03 ENCOUNTER — Other Ambulatory Visit: Payer: Self-pay

## 2013-07-03 MED ORDER — VALSARTAN 320 MG PO TABS: 320.0000 mg | ORAL_TABLET | Freq: Every day | ORAL | Status: DC

## 2013-07-07 ENCOUNTER — Encounter: Payer: Self-pay | Admitting: Family Medicine

## 2013-07-07 ENCOUNTER — Ambulatory Visit (INDEPENDENT_AMBULATORY_CARE_PROVIDER_SITE_OTHER): Payer: Medicare Other | Admitting: Family Medicine

## 2013-07-07 VITALS — BP 134/70 | HR 54 | Temp 98.3°F | Wt 145.0 lb

## 2013-07-07 DIAGNOSIS — J019 Acute sinusitis, unspecified: Secondary | ICD-10-CM

## 2013-07-07 DIAGNOSIS — I10 Essential (primary) hypertension: Secondary | ICD-10-CM

## 2013-07-07 MED ORDER — AMOXICILLIN-POT CLAVULANATE 875-125 MG PO TABS: 1.0000 | ORAL_TABLET | Freq: Two times a day (BID) | ORAL | Status: DC

## 2013-07-07 MED ORDER — METOPROLOL SUCCINATE ER 50 MG PO TB24: 50.0000 mg | ORAL_TABLET | Freq: Every day | ORAL | Status: DC

## 2013-07-07 NOTE — Patient Instructions (Signed)

## 2013-07-07 NOTE — Progress Notes (Signed)
Pre visit review using our clinic review tool, if applicable. No additional management support is needed unless otherwise documented below in the visit note. 

## 2013-07-07 NOTE — Progress Notes (Signed)
  Subjective:     Michelle Noble is a 80 y.o. female who presents for evaluation of sinus pain. Symptoms include: congestion, facial pain, headaches, nasal congestion and sinus pressure. Onset of symptoms was 1 day ago. Symptoms have been gradually worsening since that time. Past history is significant for no history of pneumonia or bronchitis. Patient is a non-smoker.  The following portions of the patient's history were reviewed and updated as appropriate: allergies, current medications, past family history, past medical history, past social history, past surgical history and problem list.  Review of Systems Pertinent items are noted in HPI.   Objective:    BP 134/70  Pulse 54  Temp(Src) 98.3 F (36.8 C) (Oral)  Wt 145 lb (65.772 kg)  SpO2 96% General appearance: cooperative, appears stated age and mild distress Ears: normal TM's and external ear canals both ears Nose: green discharge, moderate congestion, turbinates red, swollen, sinus tenderness bilateral Throat: lips, mucosa, and tongue normal; teeth and gums normal Neck: no adenopathy, supple, symmetrical, trachea midline and thyroid not enlarged, symmetric, no tenderness/mass/nodules Lungs: rhonchi bilaterally Heart: S1, S2 normal    Assessment:    Acute bacterial sinusitis.    Plan:    Nasal steroids per medication orders. Antihistamines per medication orders. Augmentin per medication orders.  1. Sinusitis, acute  - amoxicillin-clavulanate (AUGMENTIN) 875-125 MG per tablet; Take 1 tablet by mouth 2 (two) times daily.  Dispense: 20 tablet; Refill: 0  2. HTN (hypertension) Stable Refill meds - metoprolol succinate (TOPROL-XL) 50 MG 24 hr tablet; Take 1 tablet (50 mg total) by mouth daily. Take with or immediately following a meal.  Dispense: 90 tablet; Refill: 3

## 2013-07-10 ENCOUNTER — Telehealth: Payer: Self-pay | Admitting: Family Medicine

## 2013-07-10 NOTE — Telephone Encounter (Signed)
Relevant patient education mailed to patient.  

## 2013-07-20 ENCOUNTER — Ambulatory Visit (HOSPITAL_BASED_OUTPATIENT_CLINIC_OR_DEPARTMENT_OTHER)
Admission: RE | Admit: 2013-07-20 | Discharge: 2013-07-20 | Disposition: A | Discharge status: Home / Self Care | Payer: Medicare Other | Source: Ambulatory Visit | Attending: Family Medicine | Admitting: Family Medicine

## 2013-07-20 ENCOUNTER — Ambulatory Visit (INDEPENDENT_AMBULATORY_CARE_PROVIDER_SITE_OTHER): Payer: Medicare Other | Admitting: Family Medicine

## 2013-07-20 ENCOUNTER — Encounter: Payer: Self-pay | Admitting: Family Medicine

## 2013-07-20 VITALS — BP 120/76 | HR 88 | Temp 98.6°F | Wt 142.0 lb

## 2013-07-20 DIAGNOSIS — R05 Cough: Secondary | ICD-10-CM

## 2013-07-20 DIAGNOSIS — J209 Acute bronchitis, unspecified: Secondary | ICD-10-CM

## 2013-07-20 DIAGNOSIS — I517 Cardiomegaly: Secondary | ICD-10-CM | POA: Insufficient documentation

## 2013-07-20 DIAGNOSIS — R059 Cough, unspecified: Secondary | ICD-10-CM | POA: Insufficient documentation

## 2013-07-20 MED ORDER — FUROSEMIDE 20 MG PO TABS: 20.0000 mg | ORAL_TABLET | Freq: Every day | ORAL | Status: DC

## 2013-07-20 MED ORDER — AZITHROMYCIN 250 MG PO TABS: ORAL_TABLET | ORAL | Status: DC

## 2013-07-20 NOTE — Progress Notes (Signed)
Pre visit review using our clinic review tool, if applicable. No additional management support is needed unless otherwise documented below in the visit note. 

## 2013-07-20 NOTE — Progress Notes (Signed)
  Subjective:     Michelle Noble is a 80 y.o. female here for evaluation of a cough. Onset of symptoms was several days ago. Symptoms have been gradually worsening since that time. The cough is productive and is aggravated by infection. Associated symptoms include: sputum production. Patient does not have a history of asthma. Patient does not have a history of environmental allergens. Patient has not traveled recently. Patient does not have a history of smoking. Patient has not had a previous chest x-ray. Patient has not had a PPD done.  The following portions of the patient's history were reviewed and updated as appropriate: allergies, current medications, past family history, past medical history, past social history, past surgical history and problem list.  Review of Systems Pertinent items are noted in HPI.    Objective:    Oxygen saturation 97% on room air BP 120/76  Pulse 88  Temp(Src) 98.6 F (37 C) (Oral)  Wt 142 lb (64.411 kg)  SpO2 97% General appearance: alert, cooperative, appears stated age and no distress Throat: lips, mucosa, and tongue normal; teeth and gums normal Neck: no adenopathy, supple, symmetrical, trachea midline and thyroid not enlarged, symmetric, no tenderness/mass/nodules Lungs: clear to auscultation bilaterally Heart: S1, S2 normal Extremities: extremities normal, atraumatic, no cyanosis or edema    Assessment:    Acute Bronchitis    Plan:    Antibiotics per medication orders. Avoid exposure to tobacco smoke and fumes. Call if shortness of breath worsens, blood in sputum, change in character of cough, development of fever or chills, inability to maintain nutrition and hydration. Avoid exposure to tobacco smoke and fumes. Chest x-ray.

## 2013-07-20 NOTE — Patient Instructions (Signed)

## 2013-08-17 ENCOUNTER — Other Ambulatory Visit: Payer: Self-pay

## 2013-08-17 MED ORDER — MIRTAZAPINE 15 MG PO TABS: 30.0000 mg | ORAL_TABLET | Freq: Every day | ORAL | Status: DC

## 2013-08-22 ENCOUNTER — Encounter: Payer: Self-pay | Admitting: Neurology

## 2013-08-22 ENCOUNTER — Ambulatory Visit (INDEPENDENT_AMBULATORY_CARE_PROVIDER_SITE_OTHER): Payer: Medicare Other | Admitting: Neurology

## 2013-08-22 VITALS — BP 126/76 | HR 46 | Temp 98.3°F | Ht 60.5 in | Wt 144.1 lb

## 2013-08-22 DIAGNOSIS — G309 Alzheimer's disease, unspecified: Principal | ICD-10-CM

## 2013-08-22 DIAGNOSIS — F0280 Dementia in other diseases classified elsewhere without behavioral disturbance: Secondary | ICD-10-CM
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
## 2013-08-22 NOTE — Patient Instructions (Signed)
1.  Continue Namenda 10mg  twice daily. 2.  Continue mirtazepine 30mg  at bedtime 3.  Go for walks with family during the day 4.  Keep reading.  Play puzzles or brainteasers.  5.  Follow up in 6 months.

## 2013-08-28 ENCOUNTER — Telehealth: Payer: Self-pay | Admitting: Family Medicine

## 2013-08-28 ENCOUNTER — Telehealth: Payer: Self-pay | Admitting: Neurology

## 2013-08-28 NOTE — Telephone Encounter (Signed)
Caller name:Joyce Brunner Relation to MI:WOEHOZYY Call back [place]:  Reason for call:  To speak with Maudie Mercury about her mother's personal care service she receives through [employer]. Please advise.

## 2013-08-28 NOTE — Telephone Encounter (Signed)
Then let's get those forms asap.

## 2013-08-28 NOTE — Telephone Encounter (Signed)
Pt daughter joory gough needs to speak with someone about in home health care please call [phone] or [phone] (after 5)

## 2013-08-28 NOTE — Telephone Encounter (Signed)
Dr Tomi Likens I spoke with this patient daughter  Blanch Media today she is asking for a letter of appeal because [facility] is going to discharge this patient the feel she no longer needs the services such as bathing or having a sitter for a few hours a day . The daughter needs this letter as soon as possible for the appeal process she has been given 10 days

## 2013-08-28 NOTE — Telephone Encounter (Signed)
Spoke with patient and she stated mother is receiving PCS from [facility], the person who came in on said she was there to assess her needs and wanted the patient to have 7 days instead of 6 days. 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 She did not know the diagnosis and did not know any of the medications. She said when she got the letter from [facility] the patient's services were denied. The daughter has to go through an appeal process and may need for you to write a letter for her. She will call us an let us know.     KP
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
## 2013-08-29 NOTE — Telephone Encounter (Signed)
noted 

## 2013-08-30 ENCOUNTER — Encounter: Payer: Self-pay | Admitting: Internal Medicine

## 2013-08-30 ENCOUNTER — Telehealth: Payer: Self-pay | Admitting: Family Medicine

## 2013-08-30 ENCOUNTER — Ambulatory Visit (INDEPENDENT_AMBULATORY_CARE_PROVIDER_SITE_OTHER): Payer: Medicare Other | Admitting: Internal Medicine

## 2013-08-30 VITALS — BP 128/69 | HR 60 | Temp 98.2°F | Wt 142.0 lb

## 2013-08-30 DIAGNOSIS — R609 Edema, unspecified: Secondary | ICD-10-CM

## 2013-08-30 DIAGNOSIS — I10 Essential (primary) hypertension: Secondary | ICD-10-CM

## 2013-08-30 LAB — BASIC METABOLIC PANEL
BUN: 18 mg/dL (ref 6–23)
CHLORIDE: 108 meq/L (ref 96–112)
CO2: 26 meq/L (ref 19–32)
Calcium: 9.2 mg/dL (ref 8.4–10.5)
Creatinine, Ser: 1.1 mg/dL (ref 0.4–1.2)
GFR: 59.18 mL/min — ABNORMAL LOW (ref >60.00)
Glucose, Bld: 73 mg/dL (ref 70–99)
Potassium: 4.5 mEq/L (ref 3.5–5.1)
SODIUM: 142 meq/L (ref 135–145)

## 2013-08-30 LAB — MAGNESIUM: Magnesium: 2 mg/dL (ref 1.5–2.5)

## 2013-08-30 NOTE — Telephone Encounter (Signed)
Relevant patient education mailed to patient.  

## 2013-08-30 NOTE — Assessment & Plan Note (Signed)
EDEMA, EKG showed frequent PVCs otherwise looks okay Edema likely dependent, recommend leg elevation Check electrolytes including MG. Followup with PCP in 1 month, they know to call me if she's not improving

## 2013-08-30 NOTE — Patient Instructions (Signed)
Get your blood work before you leave     Next visit is for routine check up with your primary MD in 1 month  Leg elevation at least 1 hour twice a day  Low salt diet      Sodium-Controlled Diet Sodium is a mineral. It is found in many foods. Sodium may be found naturally or added during the making of a food. The most common form of sodium is salt, which is made up of sodium and chloride. Reducing your sodium intake involves changing your eating habits. The following guidelines will help you reduce the sodium in your diet:  Stop using the salt shaker.  Use salt sparingly in cooking and baking.  Substitute with sodium-free seasonings and spices.  Do not use a salt substitute (potassium chloride) without your caregiver's permission.  Include a variety of fresh, unprocessed foods in your diet.  Limit the use of processed and convenience foods that are high in sodium. USE THE FOLLOWING FOODS SPARINGLY: Breads/Starches  Commercial bread stuffing, commercial pancake or waffle mixes, coating mixes. Waffles. Croutons. Prepared (boxed or frozen) potato, rice, or noodle mixes that contain salt or sodium. Salted [place] fries or hash browns. Salted popcorn, breads, crackers, chips, or snack foods. Vegetables  Vegetables canned with salt or prepared in cream, butter, or cheese sauces. Sauerkraut. Tomato or vegetable juices canned with salt.  Fresh vegetables are allowed if rinsed thoroughly. Fruit  Fruit is okay to eat. Meat and Meat Substitutes  Salted or smoked meats, such as bacon or Canadian bacon, chipped or corned beef, hot dogs, salt pork, luncheon meats, pastrami, ham, or sausage. Canned or smoked fish, poultry, or meat. Processed cheese or cheese spreads, blue or Roquefort cheese. Battered or frozen fish products. Prepared spaghetti sauce. Baked beans. Reuben sandwiches. Salted nuts. Caviar. Milk  Limit buttermilk to 1 cup per week. Soups and Combination Foods  Bouillon  cubes, canned or dried soups, broth, consomm. Convenience (frozen or packaged) dinners with more than 600 mg sodium. Pot pies, pizza, Asian food, fast food cheeseburgers, and specialty sandwiches. Desserts and Sweets  Regular (salted) desserts, pie, commercial fruit snack pies, commercial snack cakes, canned puddings.  Eat desserts and sweets in moderation. Fats and Oils  Gravy mixes or canned gravy. No more than 1 to 2 tbs of salad dressing. Chip dips.  Eat fats and oils in moderation. Beverages  See those listed under the vegetables and milk groups. Condiments  Ketchup, mustard, meat sauces, salsa, regular (salted) and lite soy sauce or mustard. Dill pickles, olives, meat tenderizer. Prepared horseradish or pickle relish. Dutch-processed cocoa. Baking powder or baking soda used medicinally. Worcestershire sauce. "Light" salt. Salt substitute, unless approved by your caregiver. Document Released: 09/26/2001 Document Revised: 06/29/2011 Document Reviewed: 04/29/2009 [facility] Patient Information 2014 [place], [place].

## 2013-08-30 NOTE — Assessment & Plan Note (Signed)
Well-controlled, no change 

## 2013-08-30 NOTE — Progress Notes (Signed)
Pre visit review using our clinic review tool, if applicable. No additional management support is needed unless otherwise documented below in the visit note. 

## 2013-08-30 NOTE — Progress Notes (Signed)
Subjective:    [name], female    DOB: [birth year], 80 y.o.   MRN: [id number]  DOS:  08/30/2013 Type of  visit: Acute visit Here with a daughter and 2 other family members, 5 days ago, they noted B  lower extremity edema, now getting slightly better. This is not the first time she has  Edema but  has not been an issue in a while. Patient has dementia but the family denies any NSAIDs intake, no increase salt intake. She does have a sedentary lifestyle and sits a lot. Good compliance with medications.    ROS Patient has dementia, this information was gathered from the patient and family: No chest pain, difficulty breathing, orthopnea. No DOE with activities of daily living. No recent airplane or prolonged car trip  Past Medical History  Diagnosis Date  . Frequent headaches   . High blood pressure   . Hyperlipidemia   . Vitamin D deficiency   . Hypokalemia   . GERD (gastroesophageal reflux disease)   . Memory loss     Past Surgical History  Procedure Laterality Date  . Appendectomy      History   Social History  . Marital Status: Widowed    Spouse Name: N/A    Number of Children: N/A  . Years of Education: N/A   Occupational History  . Not on file.   Social History Main Topics  . Smoking status: Never Smoker   . Smokeless tobacco: Former Systems developer  . Alcohol Use: No  . Drug Use: No  . Sexual Activity: Not on file   Other Topics Concern  . Not on file   Social History Narrative  . No narrative on file        Medication List       This list is accurate as of: 08/30/13  8:13 PM.  Always use your most recent med list.               aspirin 81 MG tablet  Take 81 mg by mouth daily.     atorvastatin 20 MG tablet  Commonly known as:  LIPITOR  Take 20 mg by mouth daily.     azelastine 0.1 % nasal spray  Commonly known as:  ASTELIN  Place 1 spray into the nose 2 (two) times daily. Use in each nostril as directed     cetirizine 10 MG  tablet  Commonly known as:  ZYRTEC  Take 10 mg by mouth daily.     furosemide 20 MG tablet  Commonly known as:  LASIX  Take 1 tablet (20 mg total) by mouth daily.     memantine 10 MG tablet  Commonly known as:  NAMENDA  Take one tablet (10 mg) by mouth twice a day  **note change in mg--from 5 mg tab to the 10 mg tab**     metoprolol succinate 50 MG 24 hr tablet  Commonly known as:  TOPROL-XL  Take 1 tablet (50 mg total) by mouth daily. Take with or immediately following a meal.     mirtazapine 15 MG tablet  Commonly known as:  REMERON  Take 2 tablets (30 mg total) by mouth at bedtime.     potassium chloride 10 MEQ CR capsule  Commonly known as:  MICRO-K  Take 10 mEq by mouth 2 (two) times daily.     valsartan 320 MG tablet  Commonly known as:  DIOVAN  Take 1 tablet (320 mg total) by mouth daily.  Vitamin D 2000 UNITS Caps  Take by mouth.           Objective:   Physical Exam BP 128/69  Pulse 60  Temp(Src) 98.2 F (36.8 C)  Wt 142 lb (64.411 kg)  SpO2 100% General -- alert, well-developed,recently demented, follow instructions.  Neck --no JVD @ 45 HEENT-- Not pale. Lungs -- normal respiratory effort, no intercostal retractions, no accessory muscle use, and normal breath sounds.  Heart-- bigeminy?.  Abdomen-- Not distended, good bowel sounds,soft, non-tender. Extremities-- symmetric mild  pretibial edema    Neurologic--  Alert , recognizes her daughter, knows she is at the MD office, otherwise not oriented Psych--   No anxious or depressed appearing.          Assessment & Plan:

## 2013-10-04 ENCOUNTER — Ambulatory Visit: Payer: Medicare Other | Admitting: Family Medicine

## 2013-10-13 ENCOUNTER — Other Ambulatory Visit: Payer: Self-pay | Admitting: Neurology

## 2013-12-08 ENCOUNTER — Other Ambulatory Visit: Payer: Self-pay | Admitting: Neurology

## 2013-12-12 ENCOUNTER — Other Ambulatory Visit: Payer: Self-pay | Admitting: Family Medicine

## 2013-12-13 ENCOUNTER — Telehealth: Payer: Self-pay | Admitting: Family Medicine

## 2013-12-13 NOTE — Telephone Encounter (Signed)
Patient Information:  Caller Name: Blanch Media  Phone: [phone]  Patient: Michelle Noble, Michelle Noble  Gender: Female  DOB: 06-11-1919  Age: 80 Years  PCP: Rosalita Chessman.  Office Follow Up:  Does the office need to follow up with this patient?: Yes  Instructions For The Office: Daughter states patient needs refills for Atorvastatin, Lasix and Potassium. Patient states she called the Pharmacy and Pharmacist stated that refill request was sent to the office. Daughter states patient is completely out of Atorvastatin, Lasix and Potassium. RN reviewed [place] Record.  RN spoke with Pharmacist/[facility] at [facility] on [address] at [phone]. Pharmacist states patient does have refills remaining on Mirtazapine and Lasix. States patient needs refills for Atorvastatin and Potassium. Daughter can be reached at [phone].  RN Note:  Daughter states a possible insect bite was noted to patient's medial aspect of her right ankle 12/09/13. Daughter decribes area as raised, red area, approx. the size of a nickel with a clear fluid filled center. States area of redness noted immediately around the area. No red streaks noted. States patient complained of itching. States she applied Hydrocortisone cream with improvement of itching. No drainage noted. Daughter declines an appt. for 12/13/13. Daughter requests appt. for 12/14/13. Care advice given per guidelines. Call back parameters reviewed. Daughter verbalizes understanding. Call transferred to [place], in office, for appt. scheduling for 12/14/13. Daughter also states patient needs refills for Atorvastatin, Lasix and Potassium. Patient states she called the Pharmacy and Pharmacist stated that refill request was sent to the office. Daughter states patient is completely out of Atorvastatin, Lasix and Potassium. RN reviewed [place] Record.  RN spoke with Pharmacist/[facility] at [facility] on [address] at [phone].  Pharmacist states patient does have refills remaining on Mirtazapine and Lasix. States patient needs refills for Atorvastatin and Potassium. Daughter can be reached at [phone].  Symptoms  Reason For Call & Symptoms: Possible insect bite to medial aspect of right leg  Reviewed Health History In EMR: Yes  Reviewed Medications In EMR: Yes  Reviewed Allergies In EMR: Yes  Reviewed Surgeries / Procedures: Yes  Date of Onset of Symptoms: 12/09/2013  Treatments Tried: Hydrocortisone  Treatments Tried Worked: Yes  Guideline(s) Used:  Insect Bite  Rash or Redness - Localized  Disposition Per Guideline:   See Today in Office  Reason For Disposition Reached:   Patient wants to be seen  Advice Given:  Call Back If:  Bite looks infected (redness, red streaks, increased tenderness)  Redness getting larger and more than 48 hours after the bite  You become worse.  Call Back If:  Rash spreads or becomes worse  You become worse.  Call Back If:  Redness occurs  Fever occurs  More pimples occur  You become worse.  Patient Refused Recommendation:  Patient Refused Appt, Patient Requests Appt At Later Date  Daughter requests an appt. for 12/14/13

## 2013-12-13 NOTE — Telephone Encounter (Signed)
Schedule pt for 12/14/2013 at 10 am. [facility] a same day opening) [place]

## 2013-12-13 NOTE — Telephone Encounter (Signed)
Caller name: Blanch Media   Relation to pt: child  Call back number: [phone]   Reason for call:    Daughter of pt expressed mother may have gotten bitten by an insect due to her age, caused concern. No availability with PCP or PA transferred to [facility]

## 2013-12-13 NOTE — Telephone Encounter (Signed)
Last seen 07/20/13 and has never been filled.   Please advise     KP

## 2013-12-14 ENCOUNTER — Ambulatory Visit (INDEPENDENT_AMBULATORY_CARE_PROVIDER_SITE_OTHER): Payer: Medicare Other | Admitting: Family Medicine

## 2013-12-14 ENCOUNTER — Encounter: Payer: Self-pay | Admitting: Family Medicine

## 2013-12-14 VITALS — BP 130/92 | HR 72 | Temp 98.3°F | Ht 61.5 in | Wt 140.2 lb

## 2013-12-14 DIAGNOSIS — L03119 Cellulitis of unspecified part of limb: Secondary | ICD-10-CM

## 2013-12-14 DIAGNOSIS — Z23 Encounter for immunization: Secondary | ICD-10-CM | POA: Diagnosis not present

## 2013-12-14 DIAGNOSIS — L02419 Cutaneous abscess of limb, unspecified: Secondary | ICD-10-CM | POA: Diagnosis not present

## 2013-12-14 DIAGNOSIS — L03116 Cellulitis of left lower limb: Secondary | ICD-10-CM

## 2013-12-14 MED ORDER — CEPHALEXIN 500 MG PO CAPS: 500.0000 mg | ORAL_CAPSULE | Freq: Two times a day (BID) | ORAL | Status: DC

## 2013-12-14 NOTE — Patient Instructions (Signed)

## 2013-12-14 NOTE — Progress Notes (Signed)
   Subjective:    [name], female    DOB: [birth year], 80 y.o.   MRN: [id number]  HPI Pt here with daughter c/o blister and redness L low leg x several days.  Home health nurse noticed it.  No trauma or insect bites per pt.  No fevers.     Review of Systems As above    Objective:   Physical Exam  BP 130/92  Pulse 72  Temp(Src) 98.3 F (36.8 C) (Oral)  Ht 5' 1.5" (1.562 m)  Wt 140 lb 3.2 oz (63.594 kg)  BMI 26.06 kg/m2  SpO2 97% General appearance: alert, cooperative, appears stated age and no distress Neck: no adenopathy, supple, symmetrical, trachea midline and thyroid not enlarged, symmetric, no tenderness/mass/nodules Lungs: clear to auscultation bilaterally Heart: S1, S2 normal Skin: erythema - lower leg(s) left and blister       Area cleaned with betadine and 21g needle used to do I &D-- clear fluid drained, culture done, bandaid put in place       Assessment & Plan:  1. Cellulitis of leg, left Elevated leg, warm compresses, finish abx - cephALEXin (KEFLEX) 500 MG capsule; Take 1 capsule (500 mg total) by mouth 2 (two) times daily.  Dispense: 20 capsule; Refill: 0  2. Need for prophylactic vaccination and inoculation against influenza   - Flu Vaccine QUAD 36+ mos PF IM (Fluarix Quad PF)

## 2013-12-14 NOTE — Progress Notes (Signed)
Pre visit review using our clinic review tool, if applicable. No additional management support is needed unless otherwise documented below in the visit note. 

## 2013-12-16 LAB — WOUND CULTURE
GRAM STAIN: NONE SEEN
Gram Stain: NONE SEEN
Gram Stain: NONE SEEN
Organism ID, Bacteria: NO GROWTH

## 2014-01-19 ENCOUNTER — Other Ambulatory Visit: Payer: Self-pay

## 2014-01-19 ENCOUNTER — Telehealth: Payer: Self-pay | Admitting: Family Medicine

## 2014-01-19 MED ORDER — ATORVASTATIN CALCIUM 20 MG PO TABS: 20.0000 mg | ORAL_TABLET | Freq: Every day | ORAL | Status: DC

## 2014-01-19 MED ORDER — POTASSIUM CHLORIDE ER 10 MEQ PO CPCR: 10.0000 meq | ORAL_CAPSULE | Freq: Two times a day (BID) | ORAL | Status: DC

## 2014-01-19 NOTE — Telephone Encounter (Signed)
Rx faxed.    KP 

## 2014-01-19 NOTE — Telephone Encounter (Signed)
Caller name: Nicki Reaper Relation to pt: [facility] Call back number: [phone] Pharmacy: [facility]  Reason for call:  Needing to get refill of Rx's for patient  potassium chloride (MICRO-K) 10 MEQ CR capsule  atorvastatin (LIPITOR) 20 MG tablet

## 2014-01-19 NOTE — Telephone Encounter (Signed)
Patient states that pharmacy did not receive lipitor refill

## 2014-02-04 ENCOUNTER — Other Ambulatory Visit: Payer: Self-pay | Admitting: Family Medicine

## 2014-02-05 ENCOUNTER — Other Ambulatory Visit: Payer: Self-pay | Admitting: Family Medicine

## 2014-02-14 ENCOUNTER — Encounter: Payer: Self-pay | Admitting: Medical

## 2014-02-14 ENCOUNTER — Ambulatory Visit (INDEPENDENT_AMBULATORY_CARE_PROVIDER_SITE_OTHER): Payer: Medicare Other | Admitting: Medical

## 2014-02-14 VITALS — BP 149/88 | HR 78 | Temp 98.6°F | Ht 65.1 in | Wt 147.4 lb

## 2014-02-14 DIAGNOSIS — H578 Other specified disorders of eye and adnexa: Secondary | ICD-10-CM

## 2014-02-14 DIAGNOSIS — H5789 Other specified disorders of eye and adnexa: Secondary | ICD-10-CM | POA: Insufficient documentation

## 2014-02-14 NOTE — Progress Notes (Signed)
Subjective:    [name], female    DOB: 04-Dec-1919, 80 y.o.   MRN: [id number]  HPI   Pt in with no eye pain, no vision changes, no blurred vision, no itching or any rash. Pt had 2 wks ago quite dramatic swelling around her left eye. Pt was not seen only did cold compresses and it resolved. On Monday daughter thought her eye may have gotten early  Puffy area again  But the area never manifested as before.  Last time she did get better with only cold compresses. Actually did not even require benadryl.  No hx of injury, fall  or insect bite. No history of any severe allergic reaction reported by pt or her grandaughter.  Past Medical History  Diagnosis Date  . Frequent headaches   . High blood pressure   . Hyperlipidemia   . Vitamin D deficiency   . Hypokalemia   . GERD (gastroesophageal reflux disease)   . Memory loss     History   Social History  . Marital Status: Widowed    Spouse Name: N/A    Number of Children: N/A  . Years of Education: N/A   Occupational History  . Not on file.   Social History Main Topics  . Smoking status: Never Smoker   . Smokeless tobacco: Former Systems developer  . Alcohol Use: No  . Drug Use: No  . Sexual Activity: Not on file   Other Topics Concern  . Not on file   Social History Narrative  . No narrative on file    Past Surgical History  Procedure Laterality Date  . Appendectomy      Family History  Problem Relation Age of Onset  . Arthritis Maternal Grandmother     Maternal family  . Arthritis Mother     No Known Allergies  Current Outpatient Prescriptions on File Prior to Visit  Medication Sig Dispense Refill  . aspirin 81 MG tablet Take 81 mg by mouth daily.      Marland Kitchen atorvastatin (LIPITOR) 20 MG tablet Take 1 tablet (20 mg total) by mouth daily.  30 tablet  1  . azelastine (ASTELIN) 137 MCG/SPRAY nasal spray Place 1 spray into the nose 2 (two) times daily. Use in each nostril as directed      . cephALEXin  (KEFLEX) 500 MG capsule Take 1 capsule (500 mg total) by mouth 2 (two) times daily.  20 capsule  0  . cetirizine (ZYRTEC) 10 MG tablet Take 10 mg by mouth daily.      . Cholecalciferol (VITAMIN D) 2000 UNITS CAPS Take by mouth.      . furosemide (LASIX) 20 MG tablet TAKE ONE TABLET BY MOUTH ONE TIME DAILY   30 tablet  1  . metoprolol succinate (TOPROL-XL) 50 MG 24 hr tablet Take 1 tablet (50 mg total) by mouth daily. Take with or immediately following a meal.  90 tablet  3  . mirtazapine (REMERON) 15 MG tablet TAKE TWO TABLETS BY MOUTH DAILY AT BEDTIME   60 tablet  0  . NAMENDA 10 MG tablet Take one tablet by mouth twice daily  60 tablet  3  . potassium chloride (MICRO-K) 10 MEQ CR capsule Take 1 capsule (10 mEq total) by mouth 2 (two) times daily.  60 capsule  2  . valsartan (DIOVAN) 320 MG tablet TAKE ONE TABLET BY MOUTH ONE TIME DAILY   90 tablet  0   No current facility-administered medications on file  prior to visit.    BP 149/88  Pulse 78  Temp(Src) 98.6 F (37 C) (Oral)  Ht 5' 5.1" (1.654 m)  Wt 147 lb 6.4 oz (66.86 kg)  BMI 24.44 kg/m2  SpO2 98%    Review of Systems  Constitutional: Negative for fever, chills and fatigue.  HENT: Negative for congestion, ear discharge, ear pain, facial swelling, hearing loss, mouth sores, nosebleeds, postnasal drip, rhinorrhea, sinus pressure, sneezing, sore throat, tinnitus, trouble swallowing and voice change.   Respiratory: Negative for cough, chest tightness and wheezing.   Cardiovascular: Negative for chest pain and palpitations.  Gastrointestinal: Negative.   Genitourinary: Negative for dysuria, urgency, frequency, flank pain and genital sores.  Musculoskeletal: Negative.   Neurological: Negative.   Hematological: Negative for adenopathy. Does not bruise/bleed easily.  Psychiatric/Behavioral: Negative.        Objective:   Physical Exam  Constitutional: She is oriented to person, place, and time. She appears well-developed and  well-nourished. No distress.  HENT:  Head: Normocephalic and atraumatic.  Right Ear: External ear normal.  Eyes: Conjunctivae and EOM are normal. Pupils are equal, round, and reactive to light.  Neck: Normal range of motion. Neck supple. No JVD present. No tracheal deviation present. No thyromegaly present.  Cardiovascular: Normal rate, regular rhythm and normal heart sounds.   Pulmonary/Chest: Effort normal and breath sounds normal. No stridor. No respiratory distress. She has no wheezes. She has no rales. She exhibits no tenderness.  Abdominal: Soft. Bowel sounds are normal. She exhibits no distension and no mass. There is no tenderness. There is no rebound and no guarding.  Lymphadenopathy:    She has no cervical adenopathy.  Neurological: She is alert and oriented to person, place, and time.  Skin: She is not diaphoretic.  On inspection no abnormal swelling around periorbital region. No warmth or tenderness. Some symmetric hyperpigmentation of the skin around eyes.   Psychiatric: She has a normal mood and affect. Her behavior is normal. Judgment and thought content normal.           Assessment & Plan:

## 2014-02-14 NOTE — Progress Notes (Signed)
Pre visit review using our clinic review tool, if applicable. No additional management support is needed unless otherwise documented below in the visit note. 

## 2014-02-14 NOTE — Assessment & Plan Note (Signed)
None presently at all. 2 weeks ago daughter shows me a picture of her eye and periorbital region looked very swollen. Daughter only used cold compresses and area resolved very quickly within a couple of days. Currently based on negative exam and stable vital signs, I don't recommend any particular treatment. I want them to watch her closely and see if this reoccurs. If it does occur to same degree as prior picture shows then they need to be see here or in UC. Same day evaluation would be necessary and check her 02 sat. If swelling is occuring on and off randomly then would refer to allergist.

## 2014-02-14 NOTE — Patient Instructions (Signed)
Presently since the area is normal with no signs of reoccurrence will advise watching area. If reoccurs then would need same day evaluation. With her age and negative exam, I would not want to add medication presently that may cause side effect. Nor would I want to drop any medication since not clear if any allergic reaction. If area is on and off swelling and undetermined etiology then would recommend allergy referral.

## 2014-02-16 ENCOUNTER — Telehealth: Payer: Self-pay | Admitting: Family Medicine

## 2014-02-16 DIAGNOSIS — I10 Essential (primary) hypertension: Secondary | ICD-10-CM

## 2014-02-16 MED ORDER — METOPROLOL SUCCINATE ER 50 MG PO TB24: 50.0000 mg | ORAL_TABLET | Freq: Every day | ORAL | Status: DC

## 2014-02-16 NOTE — Telephone Encounter (Signed)
Caller name: Divita  Relation to pt: other  Call back number: [facility]  Pharmacy: [phone]  Reason for call:   Pt. daughter is insisting that the dosage for metoprolol succinate (TOPROL-XL) 50 MG 24 hr tablet   should be 50MG  because it has always been 50MG . Pharmacy needs clarification if its 100MG  and not 50MG  and how long has pt been taken 100MG . Pt daughter was given phamracy a hard time. Pharmacy in need of clarification

## 2014-02-16 NOTE — Telephone Encounter (Signed)
Spoke with the patient and the pharmacist and the pharmacist said the patient has not filled the Rx since jan and the Rx that was faxed in March was not on file and the patient had been using another pharmacy because she had not had the Metoprolol filled since 04/2013. Rx sent.   KP

## 2014-02-19 ENCOUNTER — Ambulatory Visit: Payer: Medicare Other | Admitting: Neurology

## 2014-02-27 ENCOUNTER — Ambulatory Visit (INDEPENDENT_AMBULATORY_CARE_PROVIDER_SITE_OTHER): Payer: Medicare Other | Admitting: Neurology

## 2014-02-27 ENCOUNTER — Encounter: Payer: Self-pay | Admitting: Neurology

## 2014-02-27 VITALS — BP 130/70 | HR 72 | Temp 99.0°F | Resp 16 | Ht 63.0 in | Wt 147.3 lb

## 2014-02-27 DIAGNOSIS — F0280 Dementia in other diseases classified elsewhere without behavioral disturbance: Secondary | ICD-10-CM

## 2014-02-27 DIAGNOSIS — G309 Alzheimer's disease, unspecified: Secondary | ICD-10-CM

## 2014-02-27 NOTE — Progress Notes (Signed)
NEUROLOGY FOLLOW UP OFFICE NOTE  Michelle Noble [id number]  HISTORY OF PRESENT ILLNESS: Michelle Noble is a 80 year old woman with history of depression, HTN, hyperlipidemia who follows up for Alzheimer's disease.  She is accompanied by her daughter, who helps provide history.    UPDATE: She is taking Namenda 10mg  twice daily.  She is tolerating it well.  She is living with her daughter.  She has 24 hour supervision.  Her daughter works between 8:30 am to 5:00 pm.  The home attendant comes between 5:45-9:45 pm M-F, 9am-2pm Saturday and 9am-3pm Sunday.  Her niece is there from 8am-5pm M-F, however sometimes this is not possible and the daughter needs to take off from work early.   She lives with her daughter.  Her medications need to be administered and monitored.  She requires help bathing.  She is able to dress and use the toilet herself.  She sleeps okay but will still exhibit some obsessive compulsive activity such as repeatedly packing and unpacking a suitcase.  During the day, she keeps the house clean, such as cleaning the dishes.  There has been no change in personality or behavior.  Mood is good.  HISTORY: Her daughter first noticed symptoms about 4.5 years ago.  She would be sitting with friends and family and later turn to them thinking that they just arrived there.  She began having trouble navigating while driving and would get lost, even on familiar routes.  She has left the stove on several times, setting off the fire alarm.  She would forget to fix meals but would not forget to eat however.  She is able to perform ADLs such as dressing and bathing.  She kept a lot of stuff in the house, such as papers and books, and her children were concerned of hoarding.  The house was not filthy, however.  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  She has not managed her own finances for some time.  Sometimes she would not recognize her  daughter.  She would misplace money and accuse the home aids and housekeepers of stealing.  She had a neuropsychological evaluation in March 2014, which revealed global decline of functioning, involving executive functioning, confrontational naming, and memory.  She was diagnosed with a neurodegenerative mild cognitive impairment.  She was initially started on Aricept, which was stopped due to worsening behavior.  This was reported by the home aides but not witnessed by her daughter.  She recently moved in with her daughter.  Since the move, she still sometimes thinks she lives in her own place and would continuously start packing her clothes as if she is getting ready to go home.  This usually occurs in the evening and may occasionally last into the night.  She is not agitated or combative.  She does not seem distressed and her daughter is not inconvenienced by this.  She was started on Seroquel this week to treat this.  She will sometimes see men in the house that aren't there.  While her daughter is at work, her niece cares for her in the house.  At this time, she does not feel depressed.  She is sleeping well.  She does get up a couple of times a night to go to the bathroom, but she falls right back to sleep and is well-rested in the morning.  She does not nap during the day.  She  enjoys reading.  She no longer drives.  Once or twice a month, she will go stay with her other daughter.  She is currently taking Namenda 10mg  twice daily.  She takes Remeron 30mg  qhs, which has helped the previous agitation.  She also takes Vit D 2000 IU.    08/22/13 MMSE 12/30 01/31/13 TSH 2.11. 05/11/12 B12 613  PAST MEDICAL HISTORY: Past Medical History  Diagnosis Date  . Frequent headaches   . High blood pressure   . Hyperlipidemia   . Vitamin D deficiency   . Hypokalemia   . GERD (gastroesophageal reflux disease)   . Memory loss     MEDICATIONS: Current Outpatient Prescriptions on File Prior to Visit    Medication Sig Dispense Refill  . aspirin 81 MG tablet Take 81 mg by mouth daily.    Marland Kitchen atorvastatin (LIPITOR) 20 MG tablet Take 1 tablet (20 mg total) by mouth daily. 30 tablet 1  . azelastine (ASTELIN) 137 MCG/SPRAY nasal spray Place 1 spray into the nose 2 (two) times daily. Use in each nostril as directed    . cephALEXin (KEFLEX) 500 MG capsule Take 1 capsule (500 mg total) by mouth 2 (two) times daily. 20 capsule 0  . cetirizine (ZYRTEC) 10 MG tablet Take 10 mg by mouth daily.    . Cholecalciferol (VITAMIN D) 2000 UNITS CAPS Take by mouth.    . furosemide (LASIX) 20 MG tablet TAKE ONE TABLET BY MOUTH ONE TIME DAILY  30 tablet 1  . metoprolol succinate (TOPROL-XL) 50 MG 24 hr tablet Take 1 tablet (50 mg total) by mouth daily. Take with or immediately following a meal. 90 tablet 3  . mirtazapine (REMERON) 15 MG tablet TAKE TWO TABLETS BY MOUTH DAILY AT BEDTIME  60 tablet 0  . NAMENDA 10 MG tablet Take one tablet by mouth twice daily 60 tablet 3  . potassium chloride (MICRO-K) 10 MEQ CR capsule Take 1 capsule (10 mEq total) by mouth 2 (two) times daily. 60 capsule 2  . valsartan (DIOVAN) 320 MG tablet TAKE ONE TABLET BY MOUTH ONE TIME DAILY  90 tablet 0   No current facility-administered medications on file prior to visit.    ALLERGIES: No Known Allergies  FAMILY HISTORY: Family History  Problem Relation Age of Onset  . Arthritis Maternal Grandmother     Maternal family  . Arthritis Mother     SOCIAL HISTORY: History   Social History  . Marital Status: Widowed    Spouse Name: N/A    Number of Children: N/A  . Years of Education: N/A   Occupational History  . Not on file.   Social History Main Topics  . Smoking status: Never Smoker   . Smokeless tobacco: Former Systems developer  . Alcohol Use: No  . Drug Use: No  . Sexual Activity: No   Other Topics Concern  . Not on file   Social History Narrative    REVIEW OF SYSTEMS: Constitutional: No fevers, chills, or sweats, no  generalized fatigue, change in appetite Eyes: No visual changes, double vision, eye pain Ear, nose and throat: No hearing loss, ear pain, nasal congestion, sore throat Cardiovascular: No chest pain, palpitations Respiratory:  No shortness of breath at rest or with exertion, wheezes GastrointestinaI: No nausea, vomiting, diarrhea, abdominal pain, fecal incontinence Genitourinary:  No dysuria, urinary retention or frequency Musculoskeletal:  No neck pain, back pain Integumentary: No rash, pruritus, skin lesions Neurological: as above Psychiatric: No depression, insomnia, anxiety Endocrine: No palpitations, fatigue, diaphoresis,  mood swings, change in appetite, change in weight, increased thirst Hematologic/Lymphatic:  No anemia, purpura, petechiae. Allergic/Immunologic: no itchy/runny eyes, nasal congestion, recent allergic reactions, rashes  PHYSICAL EXAM: Filed Vitals:   02/27/14 1456  BP: 130/70  Pulse: 72  Temp: 99 F (37.2 C)  Resp: 16   General: No acute distress Head:  Normocephalic/atraumatic Eyes:  Fundoscopic exam unremarkable without vessel changes, exudates, hemorrhages or papilledema. Neck: supple, no paraspinal tenderness, full range of motion Heart:  Regular rate and rhythm Lungs:  Clear to auscultation bilaterally Back: No paraspinal tenderness Neurological Exam: alert and oriented to person mostly. Attention span and concentration impaired, recent memory impaired, remote memory intact, fund of knowledge impaired.  Speech fluent and not dysarthric, language intact.   MMSE - Mini Mental State Exam 02/28/2014  Orientation to time 0  Orientation to Place 1  Registration 3  Attention/ Calculation 0  Recall 0  Language- name 2 objects 2  Language- repeat 1  Language- follow 3 step command 3  Language- read & follow direction 1  Write a sentence 1  Copy design 0  Total score 12   CN II-XII intact. Fundi not visualized.  Bulk and tone normal, muscle strength 5/5  throughout.  Sensation to light touch intact.  Deep tendon reflexes 2+ throughout except absent in ankles.  Finger to nose testing intact.  Gait normal.  IMPRESSION: Alzheimer's disease  PLAN: 1.  Continue the Namenda  2.  24 hour supervison 3.  Follow up in 6 months.  Michelle Clines, DO  CC: Garnet Koyanagi, DO

## 2014-02-27 NOTE — Patient Instructions (Signed)
1.  Continue the Namenda  2.  24 hour supervison 3.  Follow up in 6 months.

## 2014-02-28 ENCOUNTER — Encounter: Payer: Self-pay | Admitting: Neurology

## 2014-03-13 ENCOUNTER — Other Ambulatory Visit: Payer: Self-pay | Admitting: Family Medicine

## 2014-03-19 ENCOUNTER — Other Ambulatory Visit: Payer: Self-pay | Admitting: Family Medicine

## 2014-03-19 ENCOUNTER — Telehealth: Payer: Self-pay

## 2014-03-19 DIAGNOSIS — F329 Major depressive disorder, single episode, unspecified: Secondary | ICD-10-CM

## 2014-03-19 DIAGNOSIS — G4700 Insomnia, unspecified: Secondary | ICD-10-CM

## 2014-03-19 DIAGNOSIS — F32A Depression, unspecified: Secondary | ICD-10-CM

## 2014-03-19 MED ORDER — MIRTAZAPINE 15 MG PO TABS: 30.0000 mg | ORAL_TABLET | Freq: Every day | ORAL | Status: DC

## 2014-03-19 NOTE — Telephone Encounter (Signed)
Last seen 02/14/14 and filled 02/05/14 #60  Please advise    KP

## 2014-03-19 NOTE — Telephone Encounter (Signed)
Sent to pharmacy 

## 2014-04-13 ENCOUNTER — Other Ambulatory Visit: Payer: Self-pay | Admitting: Family Medicine

## 2014-04-26 ENCOUNTER — Other Ambulatory Visit: Payer: Self-pay | Admitting: Family Medicine

## 2014-04-26 ENCOUNTER — Other Ambulatory Visit: Payer: Self-pay | Admitting: Neurology

## 2014-04-27 ENCOUNTER — Telehealth: Payer: Self-pay | Admitting: *Deleted

## 2014-04-27 MED ORDER — VALSARTAN 320 MG PO TABS: 320.0000 mg | ORAL_TABLET | Freq: Every day | ORAL | Status: DC

## 2014-04-27 NOTE — Telephone Encounter (Signed)
Informed patient daughter of this and she scheduled appointment

## 2014-04-27 NOTE — Telephone Encounter (Signed)
Patient Rx sent for 30 days with no refill.  Patient needs follow-up appointment.  Please schedule.         EAL

## 2014-05-04 ENCOUNTER — Encounter: Payer: Self-pay | Admitting: Family Medicine

## 2014-05-04 ENCOUNTER — Ambulatory Visit (INDEPENDENT_AMBULATORY_CARE_PROVIDER_SITE_OTHER): Payer: Medicare Other | Admitting: Family Medicine

## 2014-05-04 VITALS — BP 120/82 | HR 81 | Temp 98.3°F | Wt 148.6 lb

## 2014-05-04 DIAGNOSIS — F329 Major depressive disorder, single episode, unspecified: Secondary | ICD-10-CM

## 2014-05-04 DIAGNOSIS — E785 Hyperlipidemia, unspecified: Secondary | ICD-10-CM

## 2014-05-04 DIAGNOSIS — J302 Other seasonal allergic rhinitis: Secondary | ICD-10-CM

## 2014-05-04 DIAGNOSIS — F32A Depression, unspecified: Secondary | ICD-10-CM

## 2014-05-04 DIAGNOSIS — I10 Essential (primary) hypertension: Secondary | ICD-10-CM

## 2014-05-04 DIAGNOSIS — G4700 Insomnia, unspecified: Secondary | ICD-10-CM

## 2014-05-04 LAB — POCT URINALYSIS DIPSTICK
BILIRUBIN UA: NEGATIVE
Blood, UA: NEGATIVE
GLUCOSE UA: NEGATIVE
Ketones, UA: NEGATIVE
Leukocytes, UA: NEGATIVE
NITRITE UA: NEGATIVE
Protein, UA: NEGATIVE
Spec Grav, UA: >=1.03
Urobilinogen, UA: NEGATIVE
pH, UA: 6

## 2014-05-04 MED ORDER — MIRTAZAPINE 15 MG PO TABS: 30.0000 mg | ORAL_TABLET | Freq: Every day | ORAL | Status: DC

## 2014-05-04 MED ORDER — VALSARTAN 320 MG PO TABS: 320.0000 mg | ORAL_TABLET | Freq: Every day | ORAL | Status: DC

## 2014-05-04 NOTE — Patient Instructions (Signed)

## 2014-05-04 NOTE — Progress Notes (Signed)
Pre visit review using our clinic review tool, if applicable. No additional management support is needed unless otherwise documented below in the visit note. 

## 2014-05-04 NOTE — Progress Notes (Signed)
Subjective:    Patient here for follow-up of elevated blood pressure and hyperlipidemia.   She is not exercising and is adherent to a low-salt diet.  Blood pressure is well controlled at home. Cardiac symptoms: none. Patient denies: chest pain, chest pressure/discomfort, claudication, dyspnea, exertional chest pressure/discomfort, fatigue, irregular heart beat, lower extremity edema, near-syncope, orthopnea, palpitations, paroxysmal nocturnal dyspnea, syncope and tachypnea. Cardiovascular risk factors: advanced age (older than 71 for men, 91 for women), dyslipidemia, hypertension and sedentary lifestyle. Use of agents associated with hypertension: none. History of target organ damage: none.  The following portions of the patient's history were reviewed and updated as appropriate:  She  has a past medical history of Frequent headaches; High blood pressure; Hyperlipidemia; Vitamin D deficiency; Hypokalemia; GERD (gastroesophageal reflux disease); and Memory loss. She  does not have any pertinent problems on file. She  has past surgical history that includes Appendectomy. Her family history includes Arthritis in her maternal grandmother and mother. She  reports that she has never smoked. She has quit using smokeless tobacco. She reports that she does not drink alcohol or use illicit drugs. She has a current medication list which includes the following prescription(s): aspirin, atorvastatin, azelastine, cetirizine, vitamin d, furosemide, memantine, metoprolol succinate, mirtazapine, potassium chloride, and valsartan. Current Outpatient Prescriptions on File Prior to Visit  Medication Sig Dispense Refill  . aspirin 81 MG tablet Take 81 mg by mouth daily.    Marland Kitchen atorvastatin (LIPITOR) 20 MG tablet Take 1 tablet (20 mg total) by mouth daily. Repeat labs are due noiw 30 tablet 0  . azelastine (ASTELIN) 137 MCG/SPRAY nasal spray Place 1 spray into the nose 2 (two) times daily. Use in each nostril as directed     . cetirizine (ZYRTEC) 10 MG tablet Take 10 mg by mouth daily.    . Cholecalciferol (VITAMIN D) 2000 UNITS CAPS Take by mouth.    . furosemide (LASIX) 20 MG tablet TAKE ONE TABLET BY MOUTH ONE TIME DAILY  30 tablet 1  . memantine (NAMENDA) 10 MG tablet TAKE ONE TABLET BY MOUTH TWICE DAILY  60 tablet 4  . metoprolol succinate (TOPROL-XL) 50 MG 24 hr tablet Take 1 tablet (50 mg total) by mouth daily. Take with or immediately following a meal. 90 tablet 3  . potassium chloride (K-DUR) 10 MEQ tablet TAKE ONE TABLET BY MOUTH TWICE DAILY  60 tablet 1   No current facility-administered medications on file prior to visit.   She has No Known Allergies..  Review of Systems Pertinent items are noted in HPI.     Objective:    BP 120/82 mmHg  Pulse 81  Temp(Src) 98.3 F (36.8 C) (Oral)  Wt 148 lb 9.6 oz (67.405 kg)  SpO2 96% General appearance: alert, cooperative and no distress Throat: lips, mucosa, and tongue normal; teeth and gums normal Neck: no adenopathy, supple, symmetrical, trachea midline and thyroid not enlarged, symmetric, no tenderness/mass/nodules Lungs: clear to auscultation bilaterally Heart: S1, S2 normal Extremities: extremities normal, atraumatic, no cyanosis or edema    Assessment:    Hypertension, normal blood pressure . Evidence of target organ damage: none.    Plan:    Medication: no change. Dietary sodium restriction. Check blood pressures 2-3 times weekly and record. Follow up: 6 months and as needed.    1. Essential hypertension Stable, con't meds - valsartan (DIOVAN) 320 MG tablet; Take 1 tablet (320 mg total) by mouth daily.  Dispense: 30 tablet; Refill: 5 - Microalbumin / creatinine urine ratio -  POCT urinalysis dipstick - Basic Metabolic Panel (BMET) - CBC with Differential - Hepatic function panel - Lipid panel  2. Hyperlipidemia Check labs, con't lipitor - Microalbumin / creatinine urine ratio - POCT urinalysis dipstick - Basic Metabolic Panel  (BMET) - CBC with Differential - Hepatic function panel - Lipid panel  3. Seasonal allergies Antihistamine and astelin  4. Depression  - mirtazapine (REMERON) 15 MG tablet; Take 2 tablets (30 mg total) by mouth at bedtime.  Dispense: 60 tablet; Refill: 5  5. Insomnia  - mirtazapine (REMERON) 15 MG tablet; Take 2 tablets (30 mg total) by mouth at bedtime.  Dispense: 60 tablet; Refill: 5

## 2014-05-05 LAB — BASIC METABOLIC PANEL
BUN: 14 mg/dL (ref 6–23)
CO2: 28 meq/L (ref 19–32)
CREATININE: 0.95 mg/dL (ref 0.50–1.10)
Calcium: 9.2 mg/dL (ref 8.4–10.5)
Chloride: 104 mEq/L (ref 96–112)
GLUCOSE: 83 mg/dL (ref 70–99)
Potassium: 4.6 mEq/L (ref 3.5–5.3)
Sodium: 141 mEq/L (ref 135–145)

## 2014-05-05 LAB — LIPID PANEL
CHOL/HDL RATIO: 3.1 ratio
CHOLESTEROL: 153 mg/dL (ref 0–200)
HDL: 50 mg/dL (ref >39)
LDL CALC: 85 mg/dL (ref 0–99)
Triglycerides: 90 mg/dL (ref <150)
VLDL: 18 mg/dL (ref 0–40)

## 2014-05-05 LAB — HEPATIC FUNCTION PANEL
ALBUMIN: 3.8 g/dL (ref 3.5–5.2)
ALK PHOS: 64 U/L (ref 39–117)
ALT: 15 U/L (ref 0–35)
AST: 19 U/L (ref 0–37)
BILIRUBIN DIRECT: 0.2 mg/dL (ref 0.0–0.3)
Indirect Bilirubin: 0.3 mg/dL (ref 0.2–1.2)
Total Bilirubin: 0.5 mg/dL (ref 0.2–1.2)
Total Protein: 7.4 g/dL (ref 6.0–8.3)

## 2014-05-05 LAB — CBC WITH DIFFERENTIAL/PLATELET
Basophils Absolute: 0 10*3/uL (ref 0.0–0.1)
Basophils Relative: 0 % (ref 0–1)
EOS ABS: 0.3 10*3/uL (ref 0.0–0.7)
Eosinophils Relative: 5 % (ref 0–5)
HEMATOCRIT: 40.4 % (ref 36.0–46.0)
HEMOGLOBIN: 13.1 g/dL (ref 12.0–15.0)
LYMPHS PCT: 44 % (ref 12–46)
Lymphs Abs: 2.9 10*3/uL (ref 0.7–4.0)
MCH: 22.5 pg — ABNORMAL LOW (ref 26.0–34.0)
MCHC: 32.4 g/dL (ref 30.0–36.0)
MCV: 69.3 fL — AB (ref 78.0–100.0)
MONOS PCT: 7 % (ref 3–12)
Monocytes Absolute: 0.5 10*3/uL (ref 0.1–1.0)
Neutro Abs: 2.9 10*3/uL (ref 1.7–7.7)
Neutrophils Relative %: 44 % (ref 43–77)
Platelets: 192 10*3/uL (ref 150–400)
RBC: 5.83 MIL/uL — ABNORMAL HIGH (ref 3.87–5.11)
RDW: 17.3 % — ABNORMAL HIGH (ref 11.5–15.5)
WBC: 6.7 10*3/uL (ref 4.0–10.5)

## 2014-05-05 LAB — MICROALBUMIN / CREATININE URINE RATIO
CREATININE, URINE: 33.7 mg/dL
MICROALB UR: 0.3 mg/dL (ref <2.0)
Microalb Creat Ratio: 8.9 mg/g (ref 0.0–30.0)

## 2014-05-22 ENCOUNTER — Other Ambulatory Visit: Payer: Self-pay | Admitting: Family Medicine

## 2014-05-23 ENCOUNTER — Other Ambulatory Visit: Payer: Self-pay | Admitting: Family Medicine

## 2014-06-28 ENCOUNTER — Other Ambulatory Visit: Payer: Self-pay | Admitting: Family Medicine

## 2014-08-05 ENCOUNTER — Other Ambulatory Visit: Payer: Self-pay | Admitting: Family Medicine

## 2014-08-09 ENCOUNTER — Encounter (HOSPITAL_BASED_OUTPATIENT_CLINIC_OR_DEPARTMENT_OTHER): Payer: Self-pay | Admitting: *Deleted

## 2014-08-09 ENCOUNTER — Emergency Department (HOSPITAL_BASED_OUTPATIENT_CLINIC_OR_DEPARTMENT_OTHER)
Admission: EM | Admit: 2014-08-09 | Discharge: 2014-08-09 | Disposition: A | Discharge status: Home / Self Care | Payer: Medicare Other | Attending: Emergency Medicine | Admitting: Emergency Medicine

## 2014-08-09 ENCOUNTER — Telehealth: Payer: Self-pay | Admitting: Family Medicine

## 2014-08-09 DIAGNOSIS — Z7982 Long term (current) use of aspirin: Secondary | ICD-10-CM | POA: Insufficient documentation

## 2014-08-09 DIAGNOSIS — E785 Hyperlipidemia, unspecified: Secondary | ICD-10-CM | POA: Insufficient documentation

## 2014-08-09 DIAGNOSIS — R197 Diarrhea, unspecified: Secondary | ICD-10-CM | POA: Insufficient documentation

## 2014-08-09 DIAGNOSIS — E860 Dehydration: Secondary | ICD-10-CM | POA: Insufficient documentation

## 2014-08-09 DIAGNOSIS — Z8719 Personal history of other diseases of the digestive system: Secondary | ICD-10-CM | POA: Diagnosis not present

## 2014-08-09 DIAGNOSIS — E559 Vitamin D deficiency, unspecified: Secondary | ICD-10-CM | POA: Diagnosis not present

## 2014-08-09 DIAGNOSIS — I10 Essential (primary) hypertension: Secondary | ICD-10-CM | POA: Insufficient documentation

## 2014-08-09 DIAGNOSIS — Z79899 Other long term (current) drug therapy: Secondary | ICD-10-CM | POA: Insufficient documentation

## 2014-08-09 LAB — CBC
HCT: 37.8 % (ref 36.0–46.0)
Hemoglobin: 12.9 g/dL (ref 12.0–15.0)
MCH: 22 pg — ABNORMAL LOW (ref 26.0–34.0)
MCHC: 34.1 g/dL (ref 30.0–36.0)
MCV: 64.5 fL — ABNORMAL LOW (ref 78.0–100.0)
Platelets: 194 10*3/uL (ref 150–400)
RBC: 5.86 MIL/uL — ABNORMAL HIGH (ref 3.87–5.11)
RDW: 17.2 % — AB (ref 11.5–15.5)
WBC: 5.5 10*3/uL (ref 4.0–10.5)

## 2014-08-09 LAB — I-STAT CG4 LACTIC ACID, ED: LACTIC ACID, VENOUS: 0.92 mmol/L (ref 0.5–2.0)

## 2014-08-09 LAB — BASIC METABOLIC PANEL
ANION GAP: 8 (ref 5–15)
BUN: 24 mg/dL — ABNORMAL HIGH (ref 6–23)
CALCIUM: 8.7 mg/dL (ref 8.4–10.5)
CO2: 24 mmol/L (ref 19–32)
Chloride: 103 mmol/L (ref 96–112)
Creatinine, Ser: 1.43 mg/dL — ABNORMAL HIGH (ref 0.50–1.10)
GFR, EST AFRICAN AMERICAN: 36 mL/min — AB (ref >90)
GFR, EST NON AFRICAN AMERICAN: 31 mL/min — AB (ref >90)
Glucose, Bld: 85 mg/dL (ref 70–99)
POTASSIUM: 4 mmol/L (ref 3.5–5.1)
SODIUM: 135 mmol/L (ref 135–145)

## 2014-08-09 MED ORDER — SODIUM CHLORIDE 0.9 % IV BOLUS (SEPSIS): 1000.0000 mL | Freq: Once | INTRAVENOUS | Status: DC

## 2014-08-09 NOTE — ED Notes (Signed)
VS obtained with pt standing, pt tolerated 3 cups, approx 290mls each of water, pt denies any weakness, pt now states she is hungry. Gait very steady, was able to stand w/ min assistance

## 2014-08-09 NOTE — ED Provider Notes (Signed)
CSN: [id number]     Arrival date & time 08/09/14  1925 History  This chart was scribed for Evelina Bucy, MD by Tula Nakayama, ED Scribe. This patient was seen in room MH11/MH11 and the patient's care was started at 8:00 PM.   Chief Complaint  Patient presents with  . Dehydration   Patient is a 81 y.o. female presenting with diarrhea. The history is provided by the patient. No language interpreter was used.  Diarrhea Quality:  Watery Severity:  Moderate Onset quality:  Gradual Duration:  2 days Timing:  Intermittent Progression:  Resolved Relieved by:  Change in diet and anti-motility medications Worsened by:  Nothing tried Associated symptoms: no abdominal pain, no fever and no vomiting     HPI Comments: Michelle Noble is a 81 y.o. female accompanied by her family, with a history of dementia, who presents to the Emergency Department complaining of intermittent, moderate diarrhea that started 2 days ago and is currently resolved. Her daughter states generalized body aches as an associated symptom. She administered Pepto Bismol and started pt on a bland diet with some relief. Pt has not had any diarrhea today. Pt's daughter reports that she had been eating a lot of chocolate cake at the onset of symptoms. Her behavior is currently baseline. Pt's daughter denies recent travel and antibiotic treatments. She also denies blood in her stool, vomiting, fever, abdominal pain and rash as associated symptoms.  Past Medical History  Diagnosis Date  . Frequent headaches   . High blood pressure   . Hyperlipidemia   . Vitamin D deficiency   . Hypokalemia   . GERD (gastroesophageal reflux disease)   . Memory loss    Past Surgical History  Procedure Laterality Date  . Appendectomy     Family History  Problem Relation Age of Onset  . Arthritis Maternal Grandmother     Maternal family  . Arthritis Mother    History  Substance Use Topics  . Smoking status: Never Smoker   . Smokeless  tobacco: Former Systems developer  . Alcohol Use: No   OB History    No data available     Review of Systems  Constitutional: Negative for fever.  Respiratory: Negative for cough and shortness of breath.   Gastrointestinal: Positive for diarrhea. Negative for vomiting, abdominal pain and blood in stool.  Skin: Negative for rash.  All other systems reviewed and are negative.  Allergies  Review of patient's allergies indicates no known allergies.  Home Medications   Prior to Admission medications   Medication Sig Start Date End Date Taking? Authorizing Provider  aspirin 81 MG tablet Take 81 mg by mouth daily.    Historical Provider, MD  atorvastatin (LIPITOR) 20 MG tablet Take 1 tablet (20 mg total) by mouth at bedtime. 05/24/14   Rosalita Chessman, DO  azelastine (ASTELIN) 137 MCG/SPRAY nasal spray Place 1 spray into the nose 2 (two) times daily. Use in each nostril as directed    Historical Provider, MD  cetirizine (ZYRTEC) 10 MG tablet Take 10 mg by mouth daily.    Historical Provider, MD  Cholecalciferol (VITAMIN D) 2000 UNITS CAPS Take by mouth.    Historical Provider, MD  furosemide (LASIX) 20 MG tablet TAKE ONE TABLET BY MOUTH ONE TIME DAILY  05/22/14   Rosalita Chessman, DO  memantine (NAMENDA) 10 MG tablet TAKE ONE TABLET BY MOUTH TWICE DAILY  04/27/14   Pieter Partridge, DO  metoprolol succinate (TOPROL-XL) 50 MG 24 hr tablet  Take 1 tablet (50 mg total) by mouth daily. Take with or immediately following a meal. 02/16/14   Rosalita Chessman, DO  mirtazapine (REMERON) 15 MG tablet Take 2 tablets (30 mg total) by mouth at bedtime. 05/04/14   Rosalita Chessman, DO  potassium chloride (K-DUR,KLOR-CON) 10 MEQ tablet TAKE ONE TABLET BY MOUTH TWICE DAILY 08/06/14   Rosalita Chessman, DO  valsartan (DIOVAN) 320 MG tablet Take 1 tablet (320 mg total) by mouth daily. 05/04/14   Alferd Apa Lowne, DO   BP 115/63 mmHg  Pulse 75  Temp(Src) 98.7 F (37.1 C) (Oral)  Resp 20  Ht 5\' 2"  (1.575 m)  Wt 130 lb (58.968 kg)  BMI 23.77  kg/m2  SpO2 100% Physical Exam  Constitutional: She is oriented to person, place, and time. She appears well-developed and well-nourished. No distress.  HENT:  Head: Normocephalic and atraumatic.  Mouth/Throat: Oropharynx is clear and moist.  Eyes: EOM are normal. Pupils are equal, round, and reactive to light.  Neck: Normal range of motion. Neck supple.  Cardiovascular: Normal rate and regular rhythm.  Exam reveals no friction rub.   No murmur heard. Pulmonary/Chest: Effort normal and breath sounds normal. No respiratory distress. She has no wheezes. She has no rales.  Abdominal: Soft. She exhibits no distension. There is no tenderness. There is no rebound.  Musculoskeletal: Normal range of motion. She exhibits no edema.  Neurological: She is alert and oriented to person, place, and time. No cranial nerve deficit. She exhibits normal muscle tone. Coordination normal.  Skin: No rash noted. She is not diaphoretic.  Nursing note and vitals reviewed.   ED Course  Procedures   DIAGNOSTIC STUDIES: Oxygen Saturation is 100% on RA, normal by my interpretation.    COORDINATION OF CARE: 8:10 PM Discussed treatment plan with pt which includes lab work. Pt agreed to plan.   Labs Review Labs Reviewed  CBC - Abnormal; Notable for the following:    RBC 5.86 (*)    MCV 64.5 (*)    MCH 22.0 (*)    RDW 17.2 (*)    All other components within normal limits  BASIC METABOLIC PANEL - Abnormal; Notable for the following:    BUN 24 (*)    Creatinine, Ser 1.43 (*)    GFR calc non Af Amer 31 (*)    GFR calc Af Amer 36 (*)    All other components within normal limits  I-STAT CG4 LACTIC ACID, ED    Imaging Review No results found.   EKG Interpretation None      MDM   Final diagnoses:  Dehydration    45F here with concern for dehydration. Diarrhea for past 2 days, decreased PO intake today. Home aide noted BP in the 90s. Daughter here with patient, denies any acute problems, no mental  status changes, no syncope. No complaints from the patient. AFVSS here. Belly benign. Will obtain labs and give fluids. Labs here show mild dehydration. She's tolerating PO here and wanting to go eat at [employer] on her way home. I do not feel she needs IVF or hospital admission. Stable for discharge.  I personally performed the services described in this documentation, which was scribed in my presence. The recorded information has been reviewed and is accurate.     Evelina Bucy, MD 08/10/14 [phone]

## 2014-08-09 NOTE — Telephone Encounter (Signed)
Caller name: Blanch Media Relation to [id number] Call back [place]:  Reason for call: Pt daughter states mother bp 22/40 and not feeling well, transfer called to [facility].

## 2014-08-09 NOTE — ED Notes (Signed)
Presents to ED w/ family, pt states has some aches and pains, daughter states has poor appetite, she states has had diarrhea also, wed am was given Pepto Bismol, which seem to resolve this issue, also her BP has been low also. Denies weakness or SOB

## 2014-08-09 NOTE — ED Notes (Signed)
Diarrhea on and off since Monday.

## 2014-08-09 NOTE — Discharge Instructions (Signed)
Dehydration Dehydration is when you lose more fluids from the body than you take in. Vital organs such as the kidneys, brain, and heart cannot function without a proper amount of fluids and salt. Any loss of fluids from the body can cause dehydration.  Older adults are at a higher risk of dehydration than younger adults. As we age, our bodies are less able to conserve water and do not respond to temperature changes as well. Also, older adults do not become thirsty as easily or quickly. Because of this, older adults often do not realize they need to increase fluids to avoid dehydration.  CAUSES   Vomiting.  Diarrhea.  Excessive sweating.  Excessive urination.  Fever.  Certain medicines, such as blood pressure medicines called diuretics.  Poorly controlled blood sugars. SIGNS AND SYMPTOMS  Mild dehydration:  Thirst.  Dry lips.  Slightly dry mouth. Moderate dehydration:  Very dry mouth.  Sunken eyes.  Skin does not bounce back quickly when lightly pinched and released.  Dark urine and decreased urine production.  Decreased tear production.  Headache. Severe dehydration:  Very dry mouth.  Extreme thirst.  Rapid, weak pulse (more than 100 beats per minute at rest).  Cold hands and feet.  Not able to sweat in spite of heat.  Rapid breathing.  Blue lips.  Confusion and lethargy.  Difficulty being awakened.  Minimal urine production.  No tears. DIAGNOSIS  Your health care provider will diagnose dehydration based on your symptoms and your exam. Blood and urine tests will help confirm the diagnosis. The diagnostic evaluation should also identify the cause of dehydration. TREATMENT  Treatment of mild or moderate dehydration can often be done at home by increasing the amount of fluids that you drink. It is best to drink small amounts of fluid more often. Drinking too much at one time can make vomiting worse. Severe dehydration needs to be treated at the hospital.  You may be given IV fluids that contain water and electrolytes. HOME CARE INSTRUCTIONS   Ask your health care provider about specific rehydration instructions.  Drink enough fluids to keep your urine clear or pale yellow.  Drink small amounts frequently if you have nausea and vomiting.  Eat as you normally do.  Avoid:  Foods or drinks high in sugar.  Carbonated drinks.  Juice.  Extremely hot or cold fluids.  Drinks with caffeine.  Fatty, greasy foods.  Alcohol.  Tobacco.  Overeating.  Gelatin desserts.  Wash your hands well to avoid spreading bacteria and viruses.  Only take over-the-counter or prescription medicines for pain, discomfort, or fever as directed by your health care provider.  Ask your health care provider if you should continue all prescribed and over-the-counter medicines.  Keep all follow-up appointments with your health care provider. SEEK MEDICAL CARE IF:  You have abdominal pain, and it increases or stays in one area (localizes).  You have a rash, stiff neck, or severe headache.  You are irritable, sleepy, or difficult to awaken.  You are weak, dizzy, or extremely thirsty.  You have a fever. SEEK IMMEDIATE MEDICAL CARE IF:   You are unable to keep fluids down, or you get worse despite treatment.  You have frequent episodes of vomiting or diarrhea.  You have blood or green matter (bile) in your vomit.  You have blood in your stool, or your stool looks black and tarry.  You have not urinated in 6-8 hours, or you have only urinated a small amount of very dark urine.    You faint. MAKE SURE YOU:   Understand these instructions.  Will watch your condition.  Will get help right away if you are not doing well or get worse. Document Released: 06/27/2003 Document Revised: 04/11/2013 Document Reviewed: 12/12/2012 Exit[employer] Patient Information 2015 [employer]. This information is not intended to replace advice given to you by your  health care provider. Make sure you discuss any questions you have with your health care provider.  

## 2014-08-10 NOTE — Telephone Encounter (Signed)
patient seen in the ED 4/21    KP

## 2014-08-24 ENCOUNTER — Ambulatory Visit (INDEPENDENT_AMBULATORY_CARE_PROVIDER_SITE_OTHER): Payer: Medicare Other | Admitting: Family Medicine

## 2014-08-24 ENCOUNTER — Encounter: Payer: Self-pay | Admitting: Family Medicine

## 2014-08-24 VITALS — BP 108/64 | HR 72 | Temp 98.3°F | Wt 140.8 lb

## 2014-08-24 DIAGNOSIS — F32A Depression, unspecified: Secondary | ICD-10-CM

## 2014-08-24 DIAGNOSIS — I10 Essential (primary) hypertension: Secondary | ICD-10-CM | POA: Diagnosis not present

## 2014-08-24 DIAGNOSIS — G4700 Insomnia, unspecified: Secondary | ICD-10-CM | POA: Diagnosis not present

## 2014-08-24 DIAGNOSIS — Z23 Encounter for immunization: Secondary | ICD-10-CM | POA: Diagnosis not present

## 2014-08-24 DIAGNOSIS — F329 Major depressive disorder, single episode, unspecified: Secondary | ICD-10-CM | POA: Diagnosis not present

## 2014-08-24 LAB — BASIC METABOLIC PANEL
BUN: 10 mg/dL (ref 6–23)
CHLORIDE: 108 meq/L (ref 96–112)
CO2: 26 meq/L (ref 19–32)
CREATININE: 1.06 mg/dL (ref 0.40–1.20)
Calcium: 9.1 mg/dL (ref 8.4–10.5)
GFR: 62.28 mL/min (ref >60.00)
Glucose, Bld: 75 mg/dL (ref 70–99)
POTASSIUM: 4.2 meq/L (ref 3.5–5.1)
Sodium: 139 mEq/L (ref 135–145)

## 2014-08-24 MED ORDER — VALSARTAN 320 MG PO TABS: 320.0000 mg | ORAL_TABLET | Freq: Every day | ORAL | Status: DC

## 2014-08-24 MED ORDER — VALSARTAN 160 MG PO TABS: 160.0000 mg | ORAL_TABLET | Freq: Every day | ORAL | Status: DC

## 2014-08-24 MED ORDER — MIRTAZAPINE 15 MG PO TABS: ORAL_TABLET | ORAL | Status: DC

## 2014-08-24 MED ORDER — PNEUMOCOCCAL VAC POLYVALENT 25 MCG/0.5ML IJ INJ
0.5000 mL | INJECTION | Freq: Once | INTRAMUSCULAR | Status: AC
  Administered 2014-08-24: 0.5 mL via INTRAMUSCULAR

## 2014-08-24 NOTE — Progress Notes (Signed)
Pre visit review using our clinic review tool, if applicable. No additional management support is needed unless otherwise documented below in the visit note. 

## 2014-08-24 NOTE — Patient Instructions (Signed)

## 2014-08-24 NOTE — Assessment & Plan Note (Signed)
Decrease diovan to 160 mg

## 2014-08-24 NOTE — Progress Notes (Signed)
[name], female    DOB: 11-27-1919  Age: 81 y.o. MRN: [id number]    Subjective:  Subjective HPI Michelle Noble presents for er f/u diarrhea and hypotension.   Pt was dehydrated and ivf given in er.    Review of Systems  Constitutional: Negative for activity change, appetite change, fatigue and unexpected weight change.  Respiratory: Negative for cough and shortness of breath.   Cardiovascular: Negative for chest pain and palpitations.  Psychiatric/Behavioral: Negative for behavioral problems and dysphoric mood. The patient is not nervous/anxious.     History Past Medical History  Diagnosis Date  . Frequent headaches   . High blood pressure   . Hyperlipidemia   . Vitamin D deficiency   . Hypokalemia   . GERD (gastroesophageal reflux disease)   . Memory loss     She has past surgical history that includes Appendectomy.   Her family history includes Arthritis in her maternal grandmother and mother.She reports that she has never smoked. She has quit using smokeless tobacco. She reports that she does not drink alcohol or use illicit drugs.  Current Outpatient Prescriptions on File Prior to Visit  Medication Sig Dispense Refill  . aspirin 81 MG tablet Take 81 mg by mouth daily.    Marland Kitchen atorvastatin (LIPITOR) 20 MG tablet Take 1 tablet (20 mg total) by mouth at bedtime. 30 tablet 5  . azelastine (ASTELIN) 137 MCG/SPRAY nasal spray Place 1 spray into the nose 2 (two) times daily. Use in each nostril as directed    . cetirizine (ZYRTEC) 10 MG tablet Take 10 mg by mouth daily.    . Cholecalciferol (VITAMIN D) 2000 UNITS CAPS Take by mouth.    . furosemide (LASIX) 20 MG tablet TAKE ONE TABLET BY MOUTH ONE TIME DAILY  30 tablet 5  . memantine (NAMENDA) 10 MG tablet TAKE ONE TABLET BY MOUTH TWICE DAILY  60 tablet 4  . metoprolol succinate (TOPROL-XL) 50 MG 24 hr tablet Take 1 tablet (50 mg total) by mouth daily. Take with or immediately following a meal. 90 tablet 3  .  potassium chloride (K-DUR,KLOR-CON) 10 MEQ tablet TAKE ONE TABLET BY MOUTH TWICE DAILY 60 tablet 2   No current facility-administered medications on file prior to visit.     Objective:  Objective Physical Exam  Constitutional: She appears well-developed and well-nourished. No distress.  HENT:  Head: Normocephalic and atraumatic.  Eyes: Conjunctivae and EOM are normal.  Neck: Normal range of motion. Neck supple. No JVD present. Carotid bruit is not present. No thyromegaly present.  Cardiovascular: Normal rate, regular rhythm and normal heart sounds.   No murmur heard. Pulmonary/Chest: Effort normal and breath sounds normal. No respiratory distress. She has no wheezes. She has no rales. She exhibits no tenderness.  Musculoskeletal: She exhibits no edema.  Neurological: She is alert.  Psychiatric: She has a normal mood and affect.   BP 108/64 mmHg  Pulse 72  Temp(Src) 98.3 F (36.8 C) (Oral)  Wt 140 lb 12.8 oz (63.866 kg)  SpO2 98% Wt Readings from Last 3 Encounters:  08/24/14 140 lb 12.8 oz (63.866 kg)  08/09/14 130 lb (58.968 kg)  05/04/14 148 lb 9.6 oz (67.405 kg)     Lab Results  Component Value Date   WBC 5.5 08/09/2014   HGB 12.9 08/09/2014   HCT 37.8 08/09/2014   PLT 194 08/09/2014   GLUCOSE 85 08/09/2014   CHOL 153 05/04/2014   TRIG 90 05/04/2014   HDL 50  05/04/2014   LDLCALC 85 05/04/2014   ALT 15 05/04/2014   AST 19 05/04/2014   NA 135 08/09/2014   K 4.0 08/09/2014   CL 103 08/09/2014   CREATININE 1.43* 08/09/2014   BUN 24* 08/09/2014   CO2 24 08/09/2014   TSH 2.11 01/31/2013   MICROALBUR 0.3 05/04/2014    No results found.   Assessment & Plan:  Plan I have discontinued Michelle Noble's valsartan and valsartan. I have also changed her mirtazapine. Additionally, I am having her start on valsartan. Lastly, I am having her maintain her Vitamin D, azelastine, aspirin, cetirizine, metoprolol succinate, memantine, furosemide, atorvastatin, and potassium  chloride. We administered pneumococcal 23 valent vaccine.  Meds ordered this encounter  Medications  . DISCONTD: valsartan (DIOVAN) 320 MG tablet    Sig: Take 1 tablet (320 mg total) by mouth daily.    Dispense:  90 tablet    Refill:  1    D/C PREVIOUS SCRIPTS FOR THIS MEDICATION  . valsartan (DIOVAN) 160 MG tablet    Sig: Take 1 tablet (160 mg total) by mouth daily.    Dispense:  90 tablet    Refill:  3  . mirtazapine (REMERON) 15 MG tablet    Sig: 1 po qhs    Dispense:  30 tablet    Refill:  5  . pneumococcal 23 valent vaccine (PNU-IMMUNE) injection 0.5 mL    Sig:     Problem List Items Addressed This Visit    Insomnia   Relevant Medications   mirtazapine (REMERON) 15 MG tablet   Hypertension - Primary    Decrease diovan to 160 mg        Relevant Medications   valsartan (DIOVAN) 160 MG tablet   Other Relevant Orders   Basic metabolic panel    Other Visit Diagnoses    Depression        Relevant Medications    mirtazapine (REMERON) 15 MG tablet    Need for pneumococcal vaccination        Relevant Medications    pneumococcal 23 valent vaccine (PNU-IMMUNE) injection 0.5 mL (Completed)       Follow-up: Return in about 2 weeks (around 09/07/2014), or if symptoms worsen or fail to improve, for bp check.  Garnet Koyanagi, DO

## 2014-08-24 NOTE — Assessment & Plan Note (Signed)
Dec remeron to15 mg qhs

## 2014-08-28 ENCOUNTER — Encounter: Payer: Self-pay | Admitting: Neurology

## 2014-08-28 ENCOUNTER — Ambulatory Visit (INDEPENDENT_AMBULATORY_CARE_PROVIDER_SITE_OTHER): Payer: Medicare Other | Admitting: Neurology

## 2014-08-28 VITALS — BP 130/70 | HR 74 | Wt 142.4 lb

## 2014-08-28 DIAGNOSIS — G309 Alzheimer's disease, unspecified: Secondary | ICD-10-CM

## 2014-08-28 DIAGNOSIS — F0280 Dementia in other diseases classified elsewhere without behavioral disturbance: Secondary | ICD-10-CM

## 2014-08-28 NOTE — Progress Notes (Signed)
NEUROLOGY FOLLOW UP OFFICE NOTE  Michelle Noble [id number]  HISTORY OF PRESENT ILLNESS: Michelle Noble is a 81 year old woman with history of depression, HTN, hyperlipidemia who follows up for Alzheimer's disease.  She is accompanied by her daughter, who helps provide history.    UPDATE: She is taking Namenda 10mg  twice daily.  Remeron was decreased to 15mg  at bedtime due to increased fatigue.  She is doing well.  She is living with her daughter.  She has 24 hour supervision.  Her daughter works between 8:30 am to 5:00 pm.  The home attendant comes between 5:45-9:45 pm M-F, 9am-2pm Saturday and 9am-3pm Sunday.  Another home attendant works from 8am-5pm M-F.  Her medications need to be administered and monitored.  She requires help bathing.  She is able to dress and use the toilet herself.  There has been no change in personality or behavior.  Mood is good.  HISTORY: Her daughter first noticed symptoms about 4.5 years ago.  She would be sitting with friends and family and later turn to them thinking that they just arrived there.  She began having trouble navigating while driving and would get lost, even on familiar routes.  She has left the stove on several times, setting off the fire alarm.  She would forget to fix meals but would not forget to eat however.  She is able to perform ADLs such as dressing and bathing.  She kept a lot of stuff in the house, such as papers and books, and her children were concerned of hoarding.  The house was not filthy, however.  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  She has not managed her own finances for some time.  Sometimes she would not recognize her daughter.  She would misplace money and accuse the home aids and housekeepers of stealing.  She had a neuropsychological evaluation in March 2014, which revealed global decline of functioning, involving executive functioning, confrontational naming, and  memory.  She was diagnosed with a neurodegenerative mild cognitive impairment.  She was initially started on Aricept, which was stopped due to worsening behavior.  This was reported by the home aides but not witnessed by her daughter.  She recently moved in with her daughter.  Since the move, she still sometimes thinks she lives in her own place and would continuously start packing her clothes as if she is getting ready to go home.  This usually occurs in the evening and may occasionally last into the night.  She is not agitated or combative.  She does not seem distressed and her daughter is not inconvenienced by this.  She was started on Seroquel this week to treat this.  She will sometimes see men in the house that aren't there.  While her daughter is at work, her niece cares for her in the house.  At this time, she does not feel depressed.  She is sleeping well.  She does get up a couple of times a night to go to the bathroom, but she falls right back to sleep and is well-rested in the morning.  She does not nap during the day.  She enjoys reading.  She no longer drives.  Once or twice a month, she will go stay with her other daughter.  PAST MEDICAL HISTORY: Past Medical History  Diagnosis Date  . Frequent headaches   . High blood pressure   . Hyperlipidemia   .  Vitamin D deficiency   . Hypokalemia   . GERD (gastroesophageal reflux disease)   . Memory loss     MEDICATIONS: Current Outpatient Prescriptions on File Prior to Visit  Medication Sig Dispense Refill  . aspirin 81 MG tablet Take 81 mg by mouth daily.    Marland Kitchen atorvastatin (LIPITOR) 20 MG tablet Take 1 tablet (20 mg total) by mouth at bedtime. 30 tablet 5  . azelastine (ASTELIN) 137 MCG/SPRAY nasal spray Place 1 spray into the nose 2 (two) times daily. Use in each nostril as directed    . cetirizine (ZYRTEC) 10 MG tablet Take 10 mg by mouth daily.    . Cholecalciferol (VITAMIN D) 2000 UNITS CAPS Take by mouth.    . furosemide (LASIX)  20 MG tablet TAKE ONE TABLET BY MOUTH ONE TIME DAILY  30 tablet 5  . memantine (NAMENDA) 10 MG tablet TAKE ONE TABLET BY MOUTH TWICE DAILY  60 tablet 4  . metoprolol succinate (TOPROL-XL) 50 MG 24 hr tablet Take 1 tablet (50 mg total) by mouth daily. Take with or immediately following a meal. 90 tablet 3  . mirtazapine (REMERON) 15 MG tablet 1 po qhs 30 tablet 5  . potassium chloride (K-DUR,KLOR-CON) 10 MEQ tablet TAKE ONE TABLET BY MOUTH TWICE DAILY 60 tablet 2  . valsartan (DIOVAN) 160 MG tablet Take 1 tablet (160 mg total) by mouth daily. 90 tablet 3   No current facility-administered medications on file prior to visit.    ALLERGIES: No Known Allergies  FAMILY HISTORY: Family History  Problem Relation Age of Onset  . Arthritis Maternal Grandmother     Maternal family  . Arthritis Mother     SOCIAL HISTORY: History   Social History  . Marital Status: Widowed    Spouse Name: N/A  . Number of Children: N/A  . Years of Education: N/A   Occupational History  . Not on file.   Social History Main Topics  . Smoking status: Never Smoker   . Smokeless tobacco: Former Systems developer  . Alcohol Use: No  . Drug Use: No  . Sexual Activity: No   Other Topics Concern  . Not on file   Social History Narrative    REVIEW OF SYSTEMS: Constitutional: No fevers, chills, or sweats, no generalized fatigue, change in appetite Eyes: No visual changes, double vision, eye pain Ear, nose and throat: No hearing loss, ear pain, nasal congestion, sore throat Cardiovascular: No chest pain, palpitations Respiratory:  No shortness of breath at rest or with exertion, wheezes GastrointestinaI: No nausea, vomiting, diarrhea, abdominal pain, fecal incontinence Genitourinary:  No dysuria, urinary retention or frequency Musculoskeletal:  No neck pain, back pain Integumentary: No rash, pruritus, skin lesions Neurological: as above Psychiatric: No depression, insomnia, anxiety Endocrine: No palpitations,  fatigue, diaphoresis, mood swings, change in appetite, change in weight, increased thirst Hematologic/Lymphatic:  No anemia, purpura, petechiae. Allergic/Immunologic: no itchy/runny eyes, nasal congestion, recent allergic reactions, rashes  PHYSICAL EXAM: Filed Vitals:   08/28/14 1342  BP: 130/70  Pulse: 74   General: No acute distress Head:  Normocephalic/atraumatic Eyes:  Fundoscopic exam unremarkable without vessel changes, exudates, hemorrhages or papilledema. Neck: supple, no paraspinal tenderness, full range of motion Heart:  Regular rate and rhythm Lungs:  Clear to auscultation bilaterally Back: No paraspinal tenderness Neurological Exam: alert and oriented to person mostly. Attention span and concentration impaired, delayed recall poor, remote memory intact, fund of knowledge impaired.  Speech fluent and not dysarthric, language intact.   CN II-XII  intact. Fundoscopic exam unremarkable without vessel changes, exudates, hemorrhages or papilledema.  Bulk and tone normal, muscle strength 5/5 throughout.  Sensation to light touch, temperature and vibration intact.  Deep tendon reflexes 2+ throughout, except absent in ankles.  Finger to nose testing intact.  Gait normal.  IMPRESSION: Alzheimer's dementia, stable  PLAN: Namenda 10mg  twice daily 24 hour supervision Follow up in 6 months.  25 minutes spent with patient, over 50% spent discussing how she is doing and management.  Metta Clines, DO  CC:  Garnet Koyanagi, DO

## 2014-08-28 NOTE — Patient Instructions (Signed)
Continue Namenda 10mg  twice daily 24 hour supervision Follow up in 6 months.

## 2014-10-19 ENCOUNTER — Other Ambulatory Visit: Payer: Self-pay | Admitting: *Deleted

## 2014-10-19 MED ORDER — MEMANTINE HCL 10 MG PO TABS: 10.0000 mg | ORAL_TABLET | Freq: Two times a day (BID) | ORAL | Status: DC

## 2014-10-24 ENCOUNTER — Other Ambulatory Visit: Payer: Self-pay | Admitting: *Deleted

## 2014-10-24 MED ORDER — MEMANTINE HCL 5 MG PO TABS: 5.0000 mg | ORAL_TABLET | Freq: Two times a day (BID) | ORAL | Status: DC

## 2014-11-06 ENCOUNTER — Ambulatory Visit (INDEPENDENT_AMBULATORY_CARE_PROVIDER_SITE_OTHER): Payer: Medicare Other | Admitting: Family Medicine

## 2014-11-06 ENCOUNTER — Encounter: Payer: Self-pay | Admitting: Family Medicine

## 2014-11-06 VITALS — BP 125/76 | HR 75 | Temp 98.3°F | Ht 62.0 in | Wt 141.4 lb

## 2014-11-06 DIAGNOSIS — H6120 Impacted cerumen, unspecified ear: Secondary | ICD-10-CM | POA: Insufficient documentation

## 2014-11-06 DIAGNOSIS — R413 Other amnesia: Secondary | ICD-10-CM

## 2014-11-06 DIAGNOSIS — L600 Ingrowing nail: Secondary | ICD-10-CM

## 2014-11-06 DIAGNOSIS — H6123 Impacted cerumen, bilateral: Secondary | ICD-10-CM | POA: Diagnosis not present

## 2014-11-06 NOTE — Progress Notes (Signed)
Pre visit review using our clinic review tool, if applicable. No additional management support is needed unless otherwise documented below in the visit note. 

## 2014-11-06 NOTE — Assessment & Plan Note (Signed)
Use debrox for several days and rto for removal

## 2014-11-06 NOTE — Patient Instructions (Signed)
Ingrown Toenail An ingrown toenail occurs when the sharp edge of your toenail grows into the skin. Causes of ingrown toenails include toenails clipped too far back or poorly fitting shoes. Activities involving sudden stops (basketball, tennis) causing "toe jamming" may lead to an ingrown nail. HOME CARE INSTRUCTIONS   Soak the whole foot in warm soapy water for 20 minutes, 3 times per day.  You may lift the edge of the nail away from the sore skin by wedging a small piece of cotton under the corner of the nail. Be careful not to dig (traumatize) and cause more injury to the area.  Wear shoes that fit well. While the ingrown nail is causing problems, sandals may be beneficial.  Trim your toenails regularly and carefully. Cut your toenails straight across, not in a curve. This will prevent injury to the skin at the corners of the toenail.  Keep your feet clean and dry.  Crutches may be helpful early in treatment if walking is painful.  Antibiotics, if prescribed, should be taken as directed.  Return for a wound check in 2 days or as directed.  Only take over-the-counter or prescription medicines for pain, discomfort, or fever as directed by your caregiver. SEEK IMMEDIATE MEDICAL CARE IF:   You have a fever.  You have increasing pain, redness, swelling, or heat at the wound site.  Your toe is not better in 7 days. If conservative treatment is not successful, surgical removal of a portion or all of the nail may be necessary. MAKE SURE YOU:   Understand these instructions.  Will watch your condition.  Will get help right away if you are not doing well or get worse. Document Released: 04/03/2000 Document Revised: 06/29/2011 Document Reviewed: 03/28/2008 Exit[employer] Patient Information 2015 [employer]. This information is not intended to replace advice given to you by your health care provider. Make sure you discuss any questions you have with your health care provider.  

## 2014-11-06 NOTE — Assessment & Plan Note (Signed)
Worsening Pt under care of neuro Also recommend daughter call [facility] for name of geriatrician psych.

## 2014-11-06 NOTE — Progress Notes (Signed)
[name], female    DOB: [birth year]  Age: 81 y.o. MRN: [id number]    Subjective:  Subjective HPI Michelle Noble presents with daughter -c/o worsening memory and wandering.  She got out of the house and walked from [place] to to [place] before she was found.   They are also requesting a referral to a podiatrist for her nails.  They are ingrown and long and thick.  Review of Systems  Constitutional: Negative for diaphoresis, appetite change, fatigue and unexpected weight change.  Eyes: Negative for pain, redness and visual disturbance.  Respiratory: Negative for cough, chest tightness, shortness of breath and wheezing.   Cardiovascular: Negative for chest pain, palpitations and leg swelling.  Endocrine: Negative for cold intolerance, heat intolerance, polydipsia, polyphagia and polyuria.  Genitourinary: Negative for dysuria, frequency and difficulty urinating.  Neurological: Negative for dizziness, light-headedness, numbness and headaches.  Psychiatric/Behavioral: Positive for confusion and decreased concentration.    History Past Medical History  Diagnosis Date  . Frequent headaches   . High blood pressure   . Hyperlipidemia   . Vitamin D deficiency   . Hypokalemia   . GERD (gastroesophageal reflux disease)   . Memory loss     She has past surgical history that includes Appendectomy.   Her family history includes Arthritis in her maternal grandmother and mother.She reports that she has never smoked. She has quit using smokeless tobacco. She reports that she does not drink alcohol or use illicit drugs.  Current Outpatient Prescriptions on File Prior to Visit  Medication Sig Dispense Refill  . aspirin 81 MG tablet Take 81 mg by mouth daily.    Marland Kitchen atorvastatin (LIPITOR) 20 MG tablet Take 1 tablet (20 mg total) by mouth at bedtime. 30 tablet 5  . azelastine (ASTELIN) 137 MCG/SPRAY nasal spray Place 1 spray into the nose 2 (two) times daily. Use in each nostril  as directed    . cetirizine (ZYRTEC) 10 MG tablet Take 10 mg by mouth daily.    . Cholecalciferol (VITAMIN D) 2000 UNITS CAPS Take by mouth.    . furosemide (LASIX) 20 MG tablet TAKE ONE TABLET BY MOUTH ONE TIME DAILY  30 tablet 5  . memantine (NAMENDA) 10 MG tablet Take 1 tablet (10 mg total) by mouth 2 (two) times daily. 60 tablet 4  . metoprolol succinate (TOPROL-XL) 50 MG 24 hr tablet Take 1 tablet (50 mg total) by mouth daily. Take with or immediately following a meal. 90 tablet 3  . mirtazapine (REMERON) 15 MG tablet 1 po qhs 30 tablet 5  . potassium chloride (K-DUR,KLOR-CON) 10 MEQ tablet TAKE ONE TABLET BY MOUTH TWICE DAILY 60 tablet 2  . valsartan (DIOVAN) 160 MG tablet Take 1 tablet (160 mg total) by mouth daily. 90 tablet 3   No current facility-administered medications on file prior to visit.     Objective:  Objective Physical Exam  Constitutional: She appears well-developed and well-nourished. No distress.  HENT:  Head: Normocephalic and atraumatic.  Right Ear: Tympanic membrane normal.  Left Ear: Tympanic membrane normal.  Nose: Mucosal edema and rhinorrhea present. Right sinus exhibits maxillary sinus tenderness and frontal sinus tenderness. Left sinus exhibits maxillary sinus tenderness and frontal sinus tenderness.  Mouth/Throat: Uvula is midline and mucous membranes are normal. Posterior oropharyngeal erythema present. No oropharyngeal exudate.  + cerumen impaction b/l   Eyes: Conjunctivae and EOM are normal. Pupils are equal, round, and reactive to light.  Neck: Normal range of motion. Neck supple.  No JVD present. Carotid bruit is not present. No thyromegaly present.  Cardiovascular: Normal rate, regular rhythm and normal heart sounds.   No murmur heard. Pulmonary/Chest: Effort normal and breath sounds normal. No respiratory distress. She has no wheezes. She has no rales. She exhibits no tenderness.  Musculoskeletal: She exhibits no edema.  Lymphadenopathy:    She  has no cervical adenopathy.  Neurological: She is disoriented.  Psychiatric: Cognition and memory are impaired. She exhibits abnormal recent memory.   Pt is wandering-- pt daughter has to lock the patio doors she she can not get out.    BP 125/76 mmHg  Pulse 75  Temp(Src) 98.3 F (36.8 C) (Oral)  Ht 5\' 2"  (1.575 m)  Wt 141 lb 6.4 oz (64.139 kg)  BMI 25.86 kg/m2  SpO2 97% Wt Readings from Last 3 Encounters:  11/06/14 141 lb 6.4 oz (64.139 kg)  08/28/14 142 lb 7 oz (64.609 kg)  08/24/14 140 lb 12.8 oz (63.866 kg)     Lab Results  Component Value Date   WBC 5.5 08/09/2014   HGB 12.9 08/09/2014   HCT 37.8 08/09/2014   PLT 194 08/09/2014   GLUCOSE 75 08/24/2014   CHOL 153 05/04/2014   TRIG 90 05/04/2014   HDL 50 05/04/2014   LDLCALC 85 05/04/2014   ALT 15 05/04/2014   AST 19 05/04/2014   NA 139 08/24/2014   K 4.2 08/24/2014   CL 108 08/24/2014   CREATININE 1.06 08/24/2014   BUN 10 08/24/2014   CO2 26 08/24/2014   TSH 2.11 01/31/2013   MICROALBUR 0.3 05/04/2014    No results found.   Assessment & Plan:  Plan I am having Ms. Welborn maintain her Vitamin D, azelastine, aspirin, cetirizine, metoprolol succinate, furosemide, atorvastatin, potassium chloride, valsartan, mirtazapine, and memantine.  No orders of the defined types were placed in this encounter.    Problem List Items Addressed This Visit    Memory loss    Worsening Pt under care of neuro Also recommend daughter call [facility] for name of geriatrician psych.       Cerumen impaction    Use debrox for several days and rto for removal       Other Visit Diagnoses    Ingrown toenail    -  Primary    Relevant Orders    Ambulatory referral to Podiatry       Follow-up: Return in about 6 months (around 05/09/2015), or if symptoms worsen or fail to improve.  Garnet Koyanagi, DO

## 2014-11-18 ENCOUNTER — Other Ambulatory Visit: Payer: Self-pay | Admitting: Family Medicine

## 2014-12-18 ENCOUNTER — Other Ambulatory Visit: Payer: Self-pay | Admitting: Family Medicine

## 2015-01-11 ENCOUNTER — Ambulatory Visit (INDEPENDENT_AMBULATORY_CARE_PROVIDER_SITE_OTHER): Payer: Medicare Other | Admitting: Family Medicine

## 2015-01-11 ENCOUNTER — Encounter: Payer: Self-pay | Admitting: Family Medicine

## 2015-01-11 VITALS — BP 104/66 | HR 68 | Temp 98.3°F | Resp 18 | Ht 62.0 in | Wt 136.4 lb

## 2015-01-11 DIAGNOSIS — M25562 Pain in left knee: Secondary | ICD-10-CM | POA: Insufficient documentation

## 2015-01-11 DIAGNOSIS — Z23 Encounter for immunization: Secondary | ICD-10-CM | POA: Diagnosis not present

## 2015-01-11 NOTE — Assessment & Plan Note (Signed)
Knee sleeve given to pt Check xray Tylenol prn Ice, elevated

## 2015-01-11 NOTE — Progress Notes (Signed)
[name], female   DOB: 03/25/1920, 81 y.o.   MRN: [id number]   Subjective:    [name], female    DOB: [birth year], 81 y.o.   MRN: [id number]  Chief Complaint  Patient presents with  . Knee Pain    L knee pain  . Abdominal Pain    HPI Patient is in today for L knee pain -- its been worsening.   Her daughter is with her and states she has been limping more and more.   She was complaining about LUQ abd pain but it is not bothering her at all now. She has been able to eat whatever she wants --no NVD.  No fever.    Past Medical History  Diagnosis Date  . Frequent headaches   . High blood pressure   . Hyperlipidemia   . Vitamin D deficiency   . Hypokalemia   . GERD (gastroesophageal reflux disease)   . Memory loss     Past Surgical History  Procedure Laterality Date  . Appendectomy      Family History  Problem Relation Age of Onset  . Arthritis Maternal Grandmother     Maternal family  . Arthritis Mother     Social History   Social History  . Marital Status: Widowed    Spouse Name: N/A  . Number of Children: N/A  . Years of Education: N/A   Occupational History  . Not on file.   Social History Main Topics  . Smoking status: Never Smoker   . Smokeless tobacco: Former Systems developer  . Alcohol Use: No  . Drug Use: No  . Sexual Activity: No   Other Topics Concern  . Not on file   Social History Narrative    Outpatient Prescriptions Prior to Visit  Medication Sig Dispense Refill  . aspirin 81 MG tablet Take 81 mg by mouth daily.    Marland Kitchen atorvastatin (LIPITOR) 20 MG tablet TAKE ONE TABLET BY MOUTH AT BEDTIME 30 tablet 3  . azelastine (ASTELIN) 137 MCG/SPRAY nasal spray Place 1 spray into the nose 2 (two) times daily. Use in each nostril as directed    . cetirizine (ZYRTEC) 10 MG tablet Take 10 mg by mouth daily.    . Cholecalciferol (VITAMIN D) 2000 UNITS CAPS Take by mouth.    . furosemide (LASIX) 20 MG tablet TAKE ONE TABLET BY  MOUTH ONE TIME DAILY 30 tablet 3  . memantine (NAMENDA) 10 MG tablet Take 1 tablet (10 mg total) by mouth 2 (two) times daily. 60 tablet 4  . metoprolol succinate (TOPROL-XL) 50 MG 24 hr tablet Take 1 tablet (50 mg total) by mouth daily. Take with or immediately following a meal. 90 tablet 3  . mirtazapine (REMERON) 15 MG tablet 1 po qhs 30 tablet 5  . potassium chloride (K-DUR,KLOR-CON) 10 MEQ tablet TAKE ONE TABLET BY MOUTH TWICE DAILY 60 tablet 2  . valsartan (DIOVAN) 160 MG tablet Take 1 tablet (160 mg total) by mouth daily. 90 tablet 3   No facility-administered medications prior to visit.    No Known Allergies  Review of Systems  Constitutional: Negative for fever and malaise/fatigue.  HENT: Negative for congestion.   Eyes: Negative for discharge.  Respiratory: Negative for shortness of breath.   Cardiovascular: Negative for chest pain, palpitations and leg swelling.  Gastrointestinal: Negative for nausea and abdominal pain.  Genitourinary: Negative for dysuria.  Musculoskeletal: Negative for falls.  Skin: Negative for rash.  Neurological: Negative  for loss of consciousness and headaches.  Endo/Heme/Allergies: Negative for environmental allergies.  Psychiatric/Behavioral: Negative for depression. The patient is not nervous/anxious.        Objective:    Physical Exam  Constitutional: She is oriented to person, place, and time. She appears well-developed and well-nourished.  HENT:  Head: Normocephalic and atraumatic.  Eyes: Conjunctivae and EOM are normal.  Neck: Normal range of motion. Neck supple. No JVD present. Carotid bruit is not present. No thyromegaly present.  Cardiovascular: Normal rate, regular rhythm and normal heart sounds.   No murmur heard. Pulmonary/Chest: Effort normal and breath sounds normal. No respiratory distress. She has no wheezes. She has no rales. She exhibits no tenderness.  Abdominal: Soft. Bowel sounds are normal. She exhibits no distension and  no mass. There is no tenderness. There is no rebound and no guarding.  Musculoskeletal: She exhibits tenderness. She exhibits no edema.       Left knee: She exhibits normal range of motion and no swelling. Tenderness found. Medial joint line and lateral joint line tenderness noted.  Neurological: She is alert and oriented to person, place, and time.  Psychiatric: She has a normal mood and affect.    BP 104/66 mmHg  Pulse 68  Temp(Src) 98.3 F (36.8 C) (Oral)  Resp 18  Ht 5\' 2"  (1.575 m)  Wt 136 lb 6.4 oz (61.871 kg)  BMI 24.94 kg/m2  SpO2 99% Wt Readings from Last 3 Encounters:  01/11/15 136 lb 6.4 oz (61.871 kg)  11/06/14 141 lb 6.4 oz (64.139 kg)  08/28/14 142 lb 7 oz (64.609 kg)     Lab Results  Component Value Date   WBC 5.5 08/09/2014   HGB 12.9 08/09/2014   HCT 37.8 08/09/2014   PLT 194 08/09/2014   GLUCOSE 75 08/24/2014   CHOL 153 05/04/2014   TRIG 90 05/04/2014   HDL 50 05/04/2014   LDLCALC 85 05/04/2014   ALT 15 05/04/2014   AST 19 05/04/2014   NA 139 08/24/2014   K 4.2 08/24/2014   CL 108 08/24/2014   CREATININE 1.06 08/24/2014   BUN 10 08/24/2014   CO2 26 08/24/2014   TSH 2.11 01/31/2013   MICROALBUR 0.3 05/04/2014    Lab Results  Component Value Date   TSH 2.11 01/31/2013   Lab Results  Component Value Date   WBC 5.5 08/09/2014   HGB 12.9 08/09/2014   HCT 37.8 08/09/2014   MCV 64.5* 08/09/2014   PLT 194 08/09/2014   Lab Results  Component Value Date   NA 139 08/24/2014   K 4.2 08/24/2014   CO2 26 08/24/2014   GLUCOSE 75 08/24/2014   BUN 10 08/24/2014   CREATININE 1.06 08/24/2014   BILITOT 0.5 05/04/2014   ALKPHOS 64 05/04/2014   AST 19 05/04/2014   ALT 15 05/04/2014   PROT 7.4 05/04/2014   ALBUMIN 3.8 05/04/2014   CALCIUM 9.1 08/24/2014   ANIONGAP 8 08/09/2014   GFR 62.28 08/24/2014   Lab Results  Component Value Date   CHOL 153 05/04/2014   Lab Results  Component Value Date   HDL 50 05/04/2014   Lab Results  Component  Value Date   LDLCALC 85 05/04/2014   Lab Results  Component Value Date   TRIG 90 05/04/2014   Lab Results  Component Value Date   CHOLHDL 3.1 05/04/2014   No results found for: HGBA1C     Assessment & Plan:   Problem List Items Addressed This Visit  Left knee pain - Primary    Knee sleeve given to pt Check xray Tylenol prn Ice, elevated      Relevant Orders   DG Knee Complete 4 Views Left    Other Visit Diagnoses    Need for prophylactic vaccination and inoculation against unspecified single disease        Relevant Orders    Flu vaccine HIGH DOSE PF (Fluzone High dose) (Completed)       I am having Ms. Pfefferle maintain her Vitamin D, azelastine, aspirin, cetirizine, metoprolol succinate, valsartan, mirtazapine, memantine, potassium chloride, furosemide, and atorvastatin.  No orders of the defined types were placed in this encounter.     Garnet Koyanagi, DO

## 2015-01-11 NOTE — Progress Notes (Signed)
Pre visit review using our clinic review tool, if applicable. No additional management support is needed unless otherwise documented below in the visit note. 

## 2015-01-31 ENCOUNTER — Telehealth: Payer: Self-pay | Admitting: Neurology

## 2015-01-31 NOTE — Telephone Encounter (Signed)
Pt /daughter Joyce/ called/ [phone]//to inform that her mother's mental status seems to be changing/ she isn't eating well/seems depressed/and had to call police to find her recently/walked out door

## 2015-01-31 NOTE — Telephone Encounter (Signed)
Please advise.  Blanch Media is aware that Dr. Tomi Likens is out of the office until tomorrow and I will call her back then.

## 2015-02-01 ENCOUNTER — Encounter: Payer: Self-pay | Admitting: *Deleted

## 2015-02-01 ENCOUNTER — Ambulatory Visit: Payer: Medicare Other | Admitting: Family Medicine

## 2015-02-01 NOTE — Telephone Encounter (Signed)
The depression may be apathy, which is a symptom of the dementia.  I wouldn't increase Remeron or change to a different antidepressant.  I would check with her PCP regarding other medications to stimulate appetite.  My main concern is how she got out of the house without anybody knowing.  She should have somebody with her 24 hours.  If somebody was home but not by the door when she wandered out, I recommend getting some kind of bell that would let somebody know if the door is being opened.

## 2015-02-01 NOTE — Telephone Encounter (Signed)
Michelle Noble has been giving the patient two 15 mg tablets qhs.  Anything else to try?

## 2015-02-01 NOTE — Telephone Encounter (Signed)
Patient's daughter given instructions. Also went over the safety issues about patient getting out of the house. She said that they have locked the door from the other side so that she can't get out.

## 2015-02-01 NOTE — Telephone Encounter (Signed)
My recommendation would be to try increasing the mirtazepine again.  She was previously on 30mg  at bedtime but the dose was decreased because it caused fatigue.  However, this medication helps with depression and increases appetite.  I would recommend increasing dose back to 30mg  at bedtime to see if she will tolerate it again.

## 2015-02-05 ENCOUNTER — Ambulatory Visit (INDEPENDENT_AMBULATORY_CARE_PROVIDER_SITE_OTHER): Payer: Medicare Other | Admitting: Family Medicine

## 2015-02-05 ENCOUNTER — Encounter: Payer: Self-pay | Admitting: Family Medicine

## 2015-02-05 VITALS — BP 110/74 | HR 71 | Temp 98.2°F | Wt 135.6 lb

## 2015-02-05 DIAGNOSIS — E785 Hyperlipidemia, unspecified: Secondary | ICD-10-CM | POA: Diagnosis not present

## 2015-02-05 DIAGNOSIS — R634 Abnormal weight loss: Secondary | ICD-10-CM | POA: Diagnosis not present

## 2015-02-05 DIAGNOSIS — I10 Essential (primary) hypertension: Secondary | ICD-10-CM

## 2015-02-05 DIAGNOSIS — F0280 Dementia in other diseases classified elsewhere without behavioral disturbance: Secondary | ICD-10-CM | POA: Diagnosis not present

## 2015-02-05 DIAGNOSIS — G309 Alzheimer's disease, unspecified: Secondary | ICD-10-CM | POA: Diagnosis not present

## 2015-02-05 DIAGNOSIS — F0283 Dementia in other diseases classified elsewhere, unspecified severity, with mood disturbance: Secondary | ICD-10-CM

## 2015-02-05 DIAGNOSIS — F329 Major depressive disorder, single episode, unspecified: Secondary | ICD-10-CM

## 2015-02-05 LAB — COMPREHENSIVE METABOLIC PANEL
ALBUMIN: 3.9 g/dL (ref 3.5–5.2)
ALK PHOS: 67 U/L (ref 39–117)
ALT: 14 U/L (ref 0–35)
AST: 17 U/L (ref 0–37)
BUN: 13 mg/dL (ref 6–23)
CALCIUM: 9.7 mg/dL (ref 8.4–10.5)
CO2: 31 mEq/L (ref 19–32)
Chloride: 104 mEq/L (ref 96–112)
Creatinine, Ser: 1.24 mg/dL — ABNORMAL HIGH (ref 0.40–1.20)
GFR: 51.91 mL/min — AB (ref >60.00)
Glucose, Bld: 86 mg/dL (ref 70–99)
POTASSIUM: 4.9 meq/L (ref 3.5–5.1)
Sodium: 140 mEq/L (ref 135–145)
TOTAL PROTEIN: 7.8 g/dL (ref 6.0–8.3)
Total Bilirubin: 0.4 mg/dL (ref 0.2–1.2)

## 2015-02-05 LAB — LIPID PANEL
CHOLESTEROL: 153 mg/dL (ref 0–200)
HDL: 43.8 mg/dL (ref >39.00)
LDL Cholesterol: 86 mg/dL (ref 0–99)
NonHDL: 108.9
TRIGLYCERIDES: 114 mg/dL (ref 0.0–149.0)
Total CHOL/HDL Ratio: 3
VLDL: 22.8 mg/dL (ref 0.0–40.0)

## 2015-02-05 MED ORDER — VALSARTAN 80 MG PO TABS
80.0000 mg | ORAL_TABLET | Freq: Every day | ORAL | Status: DC
Start: 2015-02-05 — End: 2015-05-23

## 2015-02-05 NOTE — Progress Notes (Signed)
[name], female    DOB: Jun 09, 1919  Age: 81 y.o. MRN: [id number]    Subjective:  Subjective HPI Michelle Noble presents for f/u memory and weight loss.  She had c/o at home of abd pain but daughter states she thinks it is form the dementia.   She tends to have symptoms of people on TV.  Pt states she feels fine today.   Review of Systems  Constitutional: Positive for appetite change and unexpected weight change. Negative for diaphoresis and fatigue.  Eyes: Negative for pain, redness and visual disturbance.  Respiratory: Negative for cough, chest tightness, shortness of breath and wheezing.   Cardiovascular: Negative for chest pain, palpitations and leg swelling.  Gastrointestinal: Negative for nausea, vomiting, abdominal pain and abdominal distention.  Endocrine: Negative for cold intolerance, heat intolerance, polydipsia, polyphagia and polyuria.  Genitourinary: Negative for dysuria, frequency and difficulty urinating.  Neurological: Negative for dizziness, light-headedness, numbness and headaches.  Psychiatric/Behavioral: Positive for confusion and dysphoric mood. Negative for self-injury. The patient is not nervous/anxious and is not hyperactive.     History Past Medical History  Diagnosis Date  . Frequent headaches   . High blood pressure   . Hyperlipidemia   . Vitamin D deficiency   . Hypokalemia   . GERD (gastroesophageal reflux disease)   . Memory loss     She has past surgical history that includes Appendectomy.   Her family history includes Arthritis in her maternal grandmother and mother.She reports that she has never smoked. She has quit using smokeless tobacco. She reports that she does not drink alcohol or use illicit drugs.  Current Outpatient Prescriptions on File Prior to Visit  Medication Sig Dispense Refill  . aspirin 81 MG tablet Take 81 mg by mouth daily.    Marland Kitchen atorvastatin (LIPITOR) 20 MG tablet TAKE ONE TABLET BY MOUTH AT BEDTIME 30  tablet 3  . azelastine (ASTELIN) 137 MCG/SPRAY nasal spray Place 1 spray into the nose 2 (two) times daily. Use in each nostril as directed    . cetirizine (ZYRTEC) 10 MG tablet Take 10 mg by mouth daily.    . Cholecalciferol (VITAMIN D) 2000 UNITS CAPS Take by mouth.    . furosemide (LASIX) 20 MG tablet TAKE ONE TABLET BY MOUTH ONE TIME DAILY 30 tablet 3  . memantine (NAMENDA) 10 MG tablet Take 1 tablet (10 mg total) by mouth 2 (two) times daily. 60 tablet 4  . metoprolol succinate (TOPROL-XL) 50 MG 24 hr tablet Take 1 tablet (50 mg total) by mouth daily. Take with or immediately following a meal. 90 tablet 3  . mirtazapine (REMERON) 15 MG tablet 1 po qhs 30 tablet 5  . potassium chloride (K-DUR,KLOR-CON) 10 MEQ tablet TAKE ONE TABLET BY MOUTH TWICE DAILY 60 tablet 2   No current facility-administered medications on file prior to visit.     Objective:  Objective Physical Exam  Constitutional: She appears well-developed and well-nourished.  HENT:  Head: Normocephalic and atraumatic.  Eyes: Conjunctivae and EOM are normal.  Neck: Normal range of motion. Neck supple. No JVD present. Carotid bruit is not present. No thyromegaly present.  Cardiovascular: Normal rate, regular rhythm and normal heart sounds.   No murmur heard. Pulmonary/Chest: Effort normal and breath sounds normal. No respiratory distress. She has no wheezes. She has no rales. She exhibits no tenderness.  Abdominal: Soft. Bowel sounds are normal. There is no tenderness. There is no rebound and no guarding.  Musculoskeletal: She exhibits no  edema.  Neurological: She is alert. She has normal strength. She is disoriented. No cranial nerve deficit.  Psychiatric: She has a normal mood and affect. Her behavior is normal.   BP 110/74 mmHg  Pulse 71  Temp(Src) 98.2 F (36.8 C) (Oral)  Wt 135 lb 9.6 oz (61.508 kg)  SpO2 96% Wt Readings from Last 3 Encounters:  02/05/15 135 lb 9.6 oz (61.508 kg)  01/11/15 136 lb 6.4 oz (61.871  kg)  11/06/14 141 lb 6.4 oz (64.139 kg)     Lab Results  Component Value Date   WBC 5.5 08/09/2014   HGB 12.9 08/09/2014   HCT 37.8 08/09/2014   PLT 194 08/09/2014   GLUCOSE 75 08/24/2014   CHOL 153 05/04/2014   TRIG 90 05/04/2014   HDL 50 05/04/2014   LDLCALC 85 05/04/2014   ALT 15 05/04/2014   AST 19 05/04/2014   NA 139 08/24/2014   K 4.2 08/24/2014   CL 108 08/24/2014   CREATININE 1.06 08/24/2014   BUN 10 08/24/2014   CO2 26 08/24/2014   TSH 2.11 01/31/2013   MICROALBUR 0.3 05/04/2014    No results found.   Assessment & Plan:  Plan I have discontinued Michelle Noble's valsartan. I am also having her start on valsartan. Additionally, I am having her maintain her Vitamin D, azelastine, aspirin, cetirizine, metoprolol succinate, mirtazapine, memantine, potassium chloride, furosemide, and atorvastatin.  Meds ordered this encounter  Medications  . valsartan (DIOVAN) 80 MG tablet    Sig: Take 1 tablet (80 mg total) by mouth daily.    Dispense:  30 tablet    Refill:  5    Problem List Items Addressed This Visit    Loss of weight    Pt does not like boost or ensure Daughter states she does eat when its something she really likes and when she eats with her.  She will give her the food she likes and try to et with her more to see if it makes a difference Recheck weight 3 months       Relevant Orders   Amb Referral to Palliative Care   Hypertension    Dropping-- -dec diovan to 80 mg rto 3 months or sooner prn      Relevant Medications   valsartan (DIOVAN) 80 MG tablet   Other Relevant Orders   Comp Met (CMET)   Lipid panel    Other Visit Diagnoses    Dementia in Alzheimer's disease with depression    -  Primary    Relevant Orders    Amb Referral to Palliative Care    Hyperlipidemia        Relevant Medications    valsartan (DIOVAN) 80 MG tablet    Other Relevant Orders    Comp Met (CMET)    Lipid panel       Follow-up: Return in about 3 months (around  05/08/2015), or if symptoms worsen or fail to improve.  Garnet Koyanagi, DO

## 2015-02-05 NOTE — Assessment & Plan Note (Signed)
con't namenda per neuro Pt with 24 hr care-- daughter is thinking of switching agencies

## 2015-02-05 NOTE — Assessment & Plan Note (Signed)
Dropping-- -dec diovan to 80 mg rto 3 months or sooner prn

## 2015-02-05 NOTE — Assessment & Plan Note (Signed)
Pt does not like boost or ensure Daughter states she does eat when its something she really likes and when she eats with her.  She will give her the food she likes and try to et with her more to see if it makes a difference Recheck weight 3 months

## 2015-02-05 NOTE — Patient Instructions (Signed)

## 2015-02-05 NOTE — Progress Notes (Signed)
Pre visit review using our clinic review tool, if applicable. No additional management support is needed unless otherwise documented below in the visit note. 

## 2015-02-21 ENCOUNTER — Other Ambulatory Visit: Payer: Self-pay

## 2015-02-21 DIAGNOSIS — F329 Major depressive disorder, single episode, unspecified: Secondary | ICD-10-CM

## 2015-02-21 DIAGNOSIS — F32A Depression, unspecified: Secondary | ICD-10-CM

## 2015-02-21 DIAGNOSIS — G4700 Insomnia, unspecified: Secondary | ICD-10-CM

## 2015-02-21 MED ORDER — MIRTAZAPINE 15 MG PO TABS: ORAL_TABLET | ORAL | Status: DC

## 2015-02-23 ENCOUNTER — Other Ambulatory Visit: Payer: Self-pay | Admitting: Family Medicine

## 2015-03-26 ENCOUNTER — Encounter: Payer: Self-pay | Admitting: Neurology

## 2015-03-26 ENCOUNTER — Ambulatory Visit (INDEPENDENT_AMBULATORY_CARE_PROVIDER_SITE_OTHER): Payer: Medicare Other | Admitting: Neurology

## 2015-03-26 VITALS — BP 134/76 | HR 74 | Wt 137.3 lb

## 2015-03-26 DIAGNOSIS — G301 Alzheimer's disease with late onset: Secondary | ICD-10-CM

## 2015-03-26 DIAGNOSIS — F02818 Dementia in other diseases classified elsewhere, unspecified severity, with other behavioral disturbance: Secondary | ICD-10-CM

## 2015-03-26 DIAGNOSIS — F0281 Dementia in other diseases classified elsewhere with behavioral disturbance: Secondary | ICD-10-CM

## 2015-03-26 HISTORY — DX: Dementia in other diseases classified elsewhere with behavioral disturbance: F02.81

## 2015-03-26 HISTORY — DX: Dementia in other diseases classified elsewhere, unspecified severity, with other behavioral disturbance: F02.818

## 2015-03-26 NOTE — Progress Notes (Signed)
NEUROLOGY FOLLOW UP OFFICE NOTE  Michelle Noble [id number]  HISTORY OF PRESENT ILLNESS: Michelle Noble is a 81 year old woman with history of depression, HTN, hyperlipidemia who follows up for Alzheimer's disease.  She is accompanied by her daughter, who helps provide history.    UPDATE: She is taking Namenda 10mg  twice daily.  Remeron was decreased to 15mg  at bedtime due to increased fatigue.  She is doing well.  She is living with her daughter.  She has 24 hour supervision.  Her daughter works between 8:30 am to 5:00 pm.  The home attendant comes between 5:45-9:45 pm M-F, 9am-2pm Saturday and 9am-3pm Sunday.  Another home attendant works from 8am-5pm M-F.  Her medications need to be administered and monitored.  She requires help bathing.  She is able to dress and use the toilet herself.  There has been no change in personality or behavior.  Mood is good.  Appetite is good.  She sleeps well.  She is misplacing belongings more often.  HISTORY: Her daughter first noticed symptoms about 4.5 years ago.  She would be sitting with friends and family and later turn to them thinking that they just arrived there.  She began having trouble navigating while driving and would get lost, even on familiar routes.  She has left the stove on several times, setting off the fire alarm.  She would forget to fix meals but would not forget to eat however.  She is able to perform ADLs such as dressing and bathing.  She kept a lot of stuff in the house, such as papers and books, and her children were concerned of hoarding.  The house was not filthy, however.  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  She has not managed her own finances for some time.  Sometimes she would not recognize her daughter.  She would misplace money and accuse the home aids and housekeepers of stealing.  She had a neuropsychological evaluation in March 2014, which revealed global decline  of functioning, involving executive functioning, confrontational naming, and memory.  She was diagnosed with a neurodegenerative mild cognitive impairment.  She was initially started on Aricept, which was stopped due to worsening behavior.  This was reported by the home aides but not witnessed by her daughter.  She recently moved in with her daughter.  Since the move, she still sometimes thinks she lives in her own place and would continuously start packing her clothes as if she is getting ready to go home.  This usually occurs in the evening and may occasionally last into the night.  She is not agitated or combative.  She does not seem distressed and her daughter is not inconvenienced by this.  She was started on Seroquel this week to treat this.  She will sometimes see men in the house that aren't there.  While her daughter is at work, her niece cares for her in the house.  At this time, she does not feel depressed.  She is sleeping well.  She does get up a couple of times a night to go to the bathroom, but she falls right back to sleep and is well-rested in the morning.  She does not nap during the day.  She enjoys reading.  She no longer drives.  Once or twice a month, she will go stay with her other daughter.  PAST MEDICAL HISTORY: Past Medical History  Diagnosis Date  .  Frequent headaches   . High blood pressure   . Hyperlipidemia   . Vitamin D deficiency   . Hypokalemia   . GERD (gastroesophageal reflux disease)   . Memory loss     MEDICATIONS: Current Outpatient Prescriptions on File Prior to Visit  Medication Sig Dispense Refill  . aspirin 81 MG tablet Take 81 mg by mouth daily.    Marland Kitchen atorvastatin (LIPITOR) 20 MG tablet TAKE ONE TABLET BY MOUTH AT BEDTIME 30 tablet 3  . azelastine (ASTELIN) 137 MCG/SPRAY nasal spray Place 1 spray into the nose 2 (two) times daily. Use in each nostril as directed    . cetirizine (ZYRTEC) 10 MG tablet Take 10 mg by mouth daily.    . Cholecalciferol  (VITAMIN D) 2000 UNITS CAPS Take by mouth.    . furosemide (LASIX) 20 MG tablet TAKE ONE TABLET BY MOUTH ONE TIME DAILY 30 tablet 3  . memantine (NAMENDA) 10 MG tablet Take 1 tablet (10 mg total) by mouth 2 (two) times daily. 60 tablet 4  . metoprolol succinate (TOPROL-XL) 50 MG 24 hr tablet Take 1 tablet (50 mg total) by mouth daily. Take with or immediately following a meal. 90 tablet 3  . mirtazapine (REMERON) 15 MG tablet 2 po qhs 60 tablet 5  . potassium chloride (K-DUR,KLOR-CON) 10 MEQ tablet TAKE ONE TABLET BY MOUTH TWICE DAILY 60 tablet 5  . valsartan (DIOVAN) 80 MG tablet Take 1 tablet (80 mg total) by mouth daily. 30 tablet 5   No current facility-administered medications on file prior to visit.    ALLERGIES: No Known Allergies  FAMILY HISTORY: Family History  Problem Relation Age of Onset  . Arthritis Maternal Grandmother     Maternal family  . Arthritis Mother     SOCIAL HISTORY: Social History   Social History  . Marital Status: Widowed    Spouse Name: N/A  . Number of Children: N/A  . Years of Education: N/A   Occupational History  . Not on file.   Social History Main Topics  . Smoking status: Never Smoker   . Smokeless tobacco: Former Systems developer  . Alcohol Use: No  . Drug Use: No  . Sexual Activity: No   Other Topics Concern  . Not on file   Social History Narrative    REVIEW OF SYSTEMS: Constitutional: No fevers, chills, or sweats, no generalized fatigue, change in appetite Eyes: No visual changes, double vision, eye pain Ear, nose and throat: No hearing loss, ear pain, nasal congestion, sore throat Cardiovascular: No chest pain, palpitations Respiratory:  No shortness of breath at rest or with exertion, wheezes GastrointestinaI: No nausea, vomiting, diarrhea, abdominal pain, fecal incontinence Genitourinary:  No dysuria, urinary retention or frequency Musculoskeletal:  No neck pain, back pain Integumentary: No rash, pruritus, skin  lesions Neurological: as above Psychiatric: No depression, insomnia, anxiety Endocrine: No palpitations, fatigue, diaphoresis, mood swings, change in appetite, change in weight, increased thirst Hematologic/Lymphatic:  No anemia, purpura, petechiae. Allergic/Immunologic: no itchy/runny eyes, nasal congestion, recent allergic reactions, rashes  PHYSICAL EXAM: Filed Vitals:   03/26/15 1328  BP: 134/76  Pulse: 74   General: No acute distress.  Patient appears well-groomed.  . Head:  Normocephalic/atraumatic Eyes:  Fundoscopic exam unremarkable without vessel changes, exudates, hemorrhages or papilledema. Neck: supple, no paraspinal tenderness, full range of motion Heart:  Regular rate and rhythm Lungs:  Clear to auscultation bilaterally Back: No paraspinal tenderness Neurological Exam: alert and oriented to person and state only.  Attention span  and concentration impaired, delayed recall poor, remote memory intact, fund of knowledge intact.  Speech fluent and not dysarthric, language intact.  CN II-XII intact. Bulk and tone normal, muscle strength 5/5 throughout.  Sensation to light touch intact.  Deep tendon reflexes 2+ throughout, except absent in ankles.  Finger to nose testing intact.  Gait normal.  IMPRESSION: Alzheimer's disease  PLAN: Namenda 10mg  twice daily Mirtazepine 15mg  at bedtime 24 hour supervision Follow up in 9 months.   21 minutes spent face to face with patient, over 50% spent discussing management.  Metta Clines, DO  CC:  Garnet Koyanagi, DO

## 2015-03-26 NOTE — Patient Instructions (Signed)
Continue namenda 10mg  twice daily 24 hour supervision Follow up in 9 months.

## 2015-04-04 ENCOUNTER — Ambulatory Visit (INDEPENDENT_AMBULATORY_CARE_PROVIDER_SITE_OTHER): Payer: Medicare Other | Admitting: Family Medicine

## 2015-04-04 ENCOUNTER — Encounter: Payer: Self-pay | Admitting: Family Medicine

## 2015-04-04 VITALS — BP 122/72 | HR 75 | Temp 98.1°F | Ht 62.0 in | Wt 136.0 lb

## 2015-04-04 DIAGNOSIS — R300 Dysuria: Secondary | ICD-10-CM | POA: Diagnosis not present

## 2015-04-04 DIAGNOSIS — R1084 Generalized abdominal pain: Secondary | ICD-10-CM | POA: Diagnosis not present

## 2015-04-04 LAB — POCT URINALYSIS DIPSTICK
Blood, UA: NEGATIVE
Glucose, UA: NEGATIVE
KETONES UA: NEGATIVE
Nitrite, UA: NEGATIVE
PH UA: 5.5
Spec Grav, UA: >=1.03
Urobilinogen, UA: 2

## 2015-04-04 MED ORDER — CIPROFLOXACIN HCL 250 MG PO TABS: 250.0000 mg | ORAL_TABLET | Freq: Two times a day (BID) | ORAL | Status: DC

## 2015-04-04 MED ORDER — OMEPRAZOLE 20 MG PO CPDR: 20.0000 mg | DELAYED_RELEASE_CAPSULE | Freq: Every day | ORAL | Status: DC

## 2015-04-04 NOTE — Patient Instructions (Signed)

## 2015-04-04 NOTE — Progress Notes (Signed)
03 0[name], female    DOB: Jan 15, 1920  Age: 81 y.o. MRN: [id number]    Subjective:  Subjective HPI Michelle Noble presents for L sided abd pain x 1 month.  Pain comes and goes.  She tried tums at 1 point and it helped.    Review of Systems  Constitutional: Negative for fever, chills, diaphoresis, appetite change, fatigue and unexpected weight change.  HENT: Positive for rhinorrhea and sinus pressure.   Eyes: Negative for pain, redness and visual disturbance.  Cardiovascular: Negative for chest pain, palpitations and leg swelling.  Gastrointestinal: Positive for abdominal pain. Negative for constipation, blood in stool, abdominal distention and anal bleeding.  Endocrine: Negative for cold intolerance, heat intolerance, polydipsia, polyphagia and polyuria.  Genitourinary: Negative for dysuria, frequency and difficulty urinating.  Allergic/Immunologic: Negative for environmental allergies.  Neurological: Negative for dizziness, light-headedness, numbness and headaches.    History Past Medical History  Diagnosis Date  . Frequent headaches   . High blood pressure   . Hyperlipidemia   . Vitamin D deficiency   . Hypokalemia   . GERD (gastroesophageal reflux disease)   . Memory loss     She has past surgical history that includes Appendectomy.   Her family history includes Arthritis in her maternal grandmother and mother.She reports that she has never smoked. She has quit using smokeless tobacco. She reports that she does not drink alcohol or use illicit drugs.  Current Outpatient Prescriptions on File Prior to Visit  Medication Sig Dispense Refill  . aspirin 81 MG tablet Take 81 mg by mouth daily.    Marland Kitchen atorvastatin (LIPITOR) 20 MG tablet TAKE ONE TABLET BY MOUTH AT BEDTIME 30 tablet 3  . azelastine (ASTELIN) 137 MCG/SPRAY nasal spray Place 1 spray into the nose 2 (two) times daily. Use in each nostril as directed    . cetirizine (ZYRTEC) 10 MG tablet Take 10  mg by mouth daily.    . Cholecalciferol (VITAMIN D) 2000 UNITS CAPS Take by mouth.    . furosemide (LASIX) 20 MG tablet TAKE ONE TABLET BY MOUTH ONE TIME DAILY 30 tablet 3  . memantine (NAMENDA) 10 MG tablet Take 1 tablet (10 mg total) by mouth 2 (two) times daily. 60 tablet 4  . metoprolol succinate (TOPROL-XL) 50 MG 24 hr tablet Take 1 tablet (50 mg total) by mouth daily. Take with or immediately following a meal. 90 tablet 3  . mirtazapine (REMERON) 15 MG tablet 2 po qhs 60 tablet 5  . potassium chloride (K-DUR,KLOR-CON) 10 MEQ tablet TAKE ONE TABLET BY MOUTH TWICE DAILY 60 tablet 5  . valsartan (DIOVAN) 80 MG tablet Take 1 tablet (80 mg total) by mouth daily. 30 tablet 5   No current facility-administered medications on file prior to visit.     Objective:  Objective Physical Exam  Constitutional: She is oriented to person, place, and time. She appears well-developed and well-nourished.  HENT:  Head: Normocephalic and atraumatic.  Eyes: Conjunctivae and EOM are normal.  Neck: Normal range of motion. Neck supple. No JVD present. Carotid bruit is not present. No thyromegaly present.  Cardiovascular: Normal rate, regular rhythm and normal heart sounds.   No murmur heard. Pulmonary/Chest: Effort normal and breath sounds normal. No respiratory distress. She has no wheezes. She has no rales. She exhibits no tenderness.  Musculoskeletal: She exhibits no edema.  Neurological: She is alert and oriented to person, place, and time.  Psychiatric: She has a normal mood and affect. Her  behavior is normal.   BP 122/72 mmHg  Pulse 75  Temp(Src) 98.1 F (36.7 C) (Oral)  Ht 5\' 2"  (1.575 m)  Wt 136 lb (61.689 kg)  BMI 24.87 kg/m2  SpO2 95% Wt Readings from Last 3 Encounters:  04/04/15 136 lb (61.689 kg)  03/26/15 137 lb 4.8 oz (62.279 kg)  02/05/15 135 lb 9.6 oz (61.508 kg)     Lab Results  Component Value Date   WBC 5.5 08/09/2014   HGB 12.9 08/09/2014   HCT 37.8 08/09/2014   PLT 194  08/09/2014   GLUCOSE 86 02/05/2015   CHOL 153 02/05/2015   TRIG 114.0 02/05/2015   HDL 43.80 02/05/2015   LDLCALC 86 02/05/2015   ALT 14 02/05/2015   AST 17 02/05/2015   NA 140 02/05/2015   K 4.9 02/05/2015   CL 104 02/05/2015   CREATININE 1.24* 02/05/2015   BUN 13 02/05/2015   CO2 31 02/05/2015   TSH 2.11 01/31/2013   MICROALBUR 0.3 05/04/2014    No results found.   Assessment & Plan:  Plan I am having Michelle Noble start on ciprofloxacin and omeprazole. I am also having her maintain her Vitamin D, azelastine, aspirin, cetirizine, metoprolol succinate, memantine, furosemide, atorvastatin, valsartan, mirtazapine, and potassium chloride.  Meds ordered this encounter  Medications  . ciprofloxacin (CIPRO) 250 MG tablet    Sig: Take 1 tablet (250 mg total) by mouth 2 (two) times daily.    Dispense:  6 tablet    Refill:  0  . omeprazole (PRILOSEC) 20 MG capsule    Sig: Take 1 capsule (20 mg total) by mouth daily.    Dispense:  30 capsule    Refill:  3    Problem List Items Addressed This Visit    None    Visit Diagnoses    Dysuria    -  Primary    Relevant Medications    ciprofloxacin (CIPRO) 250 MG tablet    omeprazole (PRILOSEC) 20 MG capsule    Abdominal pain, generalized        Relevant Medications    omeprazole (PRILOSEC) 20 MG capsule       Follow-up: Return if symptoms worsen or fail to improve.  Garnet Koyanagi, DO

## 2015-04-04 NOTE — Progress Notes (Signed)
Pre visit review using our clinic review tool, if applicable. No additional management support is needed unless otherwise documented below in the visit note. 

## 2015-04-04 NOTE — Addendum Note (Signed)
Addended by: Ewing Schlein on: 04/04/2015 05:08 PM   Modules accepted: Orders

## 2015-04-06 LAB — URINE CULTURE

## 2015-04-08 ENCOUNTER — Other Ambulatory Visit: Payer: Self-pay | Admitting: Family Medicine

## 2015-04-08 ENCOUNTER — Other Ambulatory Visit: Payer: Self-pay | Admitting: Neurology

## 2015-04-08 NOTE — Telephone Encounter (Signed)
Last OV: 03/26/15 Next OV: 12/25/15

## 2015-04-11 ENCOUNTER — Telehealth: Payer: Self-pay | Admitting: Family Medicine

## 2015-04-11 DIAGNOSIS — R109 Unspecified abdominal pain: Secondary | ICD-10-CM

## 2015-04-11 NOTE — Telephone Encounter (Signed)
Daughter Michelle Noble called stating pt has taken all her medication (cipro) and taking the daily ones. She is still have side pains. Per daughter an imaging test might be ordered. Please f/u as needed.

## 2015-04-11 NOTE — Telephone Encounter (Signed)
[place] abd     Dx abd pain

## 2015-04-11 NOTE — Telephone Encounter (Signed)
Please advise      KP 

## 2015-04-12 ENCOUNTER — Ambulatory Visit (HOSPITAL_BASED_OUTPATIENT_CLINIC_OR_DEPARTMENT_OTHER)
Admission: RE | Admit: 2015-04-12 | Discharge: 2015-04-12 | Disposition: A | Discharge status: Home / Self Care | Payer: Medicare Other | Source: Ambulatory Visit | Attending: Family Medicine | Admitting: Family Medicine

## 2015-04-12 DIAGNOSIS — R109 Unspecified abdominal pain: Secondary | ICD-10-CM | POA: Insufficient documentation

## 2015-04-12 DIAGNOSIS — K869 Disease of pancreas, unspecified: Secondary | ICD-10-CM | POA: Diagnosis not present

## 2015-04-12 NOTE — Telephone Encounter (Signed)
Order in      KP 

## 2015-04-13 ENCOUNTER — Other Ambulatory Visit: Payer: Self-pay | Admitting: Family Medicine

## 2015-04-16 ENCOUNTER — Other Ambulatory Visit: Payer: Self-pay

## 2015-04-16 ENCOUNTER — Other Ambulatory Visit: Payer: Self-pay | Admitting: Family Medicine

## 2015-04-16 ENCOUNTER — Encounter (HOSPITAL_BASED_OUTPATIENT_CLINIC_OR_DEPARTMENT_OTHER): Payer: Self-pay

## 2015-04-16 ENCOUNTER — Ambulatory Visit (HOSPITAL_BASED_OUTPATIENT_CLINIC_OR_DEPARTMENT_OTHER)
Admission: RE | Admit: 2015-04-16 | Discharge: 2015-04-16 | Disposition: A | Discharge status: Home / Self Care | Payer: Medicare Other | Source: Ambulatory Visit | Attending: Family Medicine | Admitting: Family Medicine

## 2015-04-16 ENCOUNTER — Other Ambulatory Visit (INDEPENDENT_AMBULATORY_CARE_PROVIDER_SITE_OTHER): Payer: Medicare Other

## 2015-04-16 DIAGNOSIS — K8689 Other specified diseases of pancreas: Secondary | ICD-10-CM

## 2015-04-16 DIAGNOSIS — R590 Localized enlarged lymph nodes: Secondary | ICD-10-CM | POA: Diagnosis not present

## 2015-04-16 DIAGNOSIS — K838 Other specified diseases of biliary tract: Secondary | ICD-10-CM | POA: Diagnosis not present

## 2015-04-16 DIAGNOSIS — R1084 Generalized abdominal pain: Secondary | ICD-10-CM | POA: Diagnosis not present

## 2015-04-16 DIAGNOSIS — R911 Solitary pulmonary nodule: Secondary | ICD-10-CM | POA: Insufficient documentation

## 2015-04-16 DIAGNOSIS — K869 Disease of pancreas, unspecified: Secondary | ICD-10-CM | POA: Insufficient documentation

## 2015-04-16 LAB — BASIC METABOLIC PANEL
BUN: 12 mg/dL (ref 6–23)
CO2: 29 mEq/L (ref 19–32)
CREATININE: 1.15 mg/dL (ref 0.40–1.20)
Calcium: 9.5 mg/dL (ref 8.4–10.5)
Chloride: 104 mEq/L (ref 96–112)
GFR: 56.61 mL/min — AB (ref >60.00)
Glucose, Bld: 94 mg/dL (ref 70–99)
Potassium: 4.4 mEq/L (ref 3.5–5.1)
Sodium: 141 mEq/L (ref 135–145)

## 2015-04-16 MED ORDER — IOHEXOL 350 MG/ML SOLN
100.0000 mL | Freq: Once | INTRAVENOUS | Status: AC | PRN
  Administered 2015-04-16: 100 mL via INTRAVENOUS

## 2015-04-16 MED ORDER — HYDROCODONE-ACETAMINOPHEN 5-325 MG PO TABS: 1.0000 | ORAL_TABLET | Freq: Four times a day (QID) | ORAL | Status: DC | PRN

## 2015-04-18 ENCOUNTER — Telehealth: Payer: Self-pay | Admitting: *Deleted

## 2015-04-18 NOTE — Telephone Encounter (Signed)
Oncology Nurse Navigator Documentation  Oncology Nurse Navigator Flowsheets 04/18/2015  Referral date to RadOnc/MedOnc 04/17/2015  Navigator Encounter Type Introductory phone call  Spoke with daughter, Blanch Media and provided new patient appointment for 05/02/15 at 2:30 pm with Dr. Truitt Merle. Informed of location of [place], valet service, and registration process. Reminded to bring insurance cards and a current medication list, including supplements. Daughter verbalizes understanding. Welcome packet mailed to daughter's home.

## 2015-04-19 ENCOUNTER — Telehealth: Payer: Self-pay | Admitting: Family Medicine

## 2015-04-19 NOTE — Telephone Encounter (Signed)
Relation to [id number] Call back number:[phone]   Reason for call:  Daughter would like clinical advice regarding patient being on  HYDROcodone-acetaminophen (NORCO/VICODIN) 5-325 MG tablet and would to like MD to look at patient medication list and advise if certain medication need to be D/C. Please advise

## 2015-04-19 NOTE — Telephone Encounter (Signed)
It is ok to take pain meds with other med on her list.  There are meds that can be d/c depending on what family wants buit they do not need to be stopped due to vicodin---  Hospice should probably be involved if family agrees

## 2015-04-19 NOTE — Telephone Encounter (Signed)
Michelle Noble is aware and verbalized understanding, she agreed to the Hospice referral. I will fax on Tuesday.    KP

## 2015-04-19 NOTE — Telephone Encounter (Signed)
Please advise      KP 

## 2015-04-24 ENCOUNTER — Telehealth: Payer: Self-pay | Admitting: *Deleted

## 2015-04-24 NOTE — Telephone Encounter (Signed)
  Oncology Nurse Navigator Documentation  Navigator Location: [facility] (04/24/15 1500) Navigator Encounter Type: Telephone (04/24/15 1500) Telephone: Lahoma Crocker Call;Appt Confirmation/Clarification (04/24/15 1500) Abnormal Finding Date: 04/12/15 (04/24/15 1500)  Spoke with daughter to move her appointment to 05/02/15 at 2 pm with Dr. Irene Limbo. She agrees to the change.

## 2015-04-29 ENCOUNTER — Telehealth: Payer: Self-pay | Admitting: Family Medicine

## 2015-04-29 DIAGNOSIS — K8689 Other specified diseases of pancreas: Secondary | ICD-10-CM

## 2015-04-29 MED ORDER — HYDROCODONE-ACETAMINOPHEN 5-325 MG PO TABS: 1.0000 | ORAL_TABLET | ORAL | Status: DC | PRN

## 2015-04-29 NOTE — Telephone Encounter (Signed)
Last seen 04/04/15 and filled 04/16/15 #30, patient is in a great deal of pain, she said they are sharp pains and she is having to give the pills to her more frequent. Once the pain is gone she eats better, not eating while in pain. Hospice will not see her until after her oncology apt. Please advise    KP

## 2015-04-29 NOTE — Telephone Encounter (Signed)
vicodin 5/ 325 mg 1 po q4 prn #[id number]

## 2015-04-29 NOTE — Telephone Encounter (Signed)
Caller name: Blanch Media  Relationship to patient: Daughter  Can be reached: [phone] or [phone]    Reason for call: Request refill on HYDROcodone-acetaminophen (NORCO/VICODIN) 5-325 MG tablet [id number]   FYI: Daughter also request a called back about the patients level of pain.

## 2015-04-29 NOTE — Telephone Encounter (Signed)
Patient aware she can pick up the Rx tomorrow.     KP

## 2015-05-02 ENCOUNTER — Ambulatory Visit: Payer: Medicare Other | Admitting: Hematology

## 2015-05-02 ENCOUNTER — Encounter: Payer: Self-pay | Admitting: Hematology

## 2015-05-02 ENCOUNTER — Ambulatory Visit (HOSPITAL_BASED_OUTPATIENT_CLINIC_OR_DEPARTMENT_OTHER): Payer: Medicare Other | Admitting: Hematology

## 2015-05-02 VITALS — BP 141/71 | HR 85 | Temp 98.3°F | Resp 16 | Ht 62.0 in | Wt 135.5 lb

## 2015-05-02 DIAGNOSIS — C251 Malignant neoplasm of body of pancreas: Secondary | ICD-10-CM

## 2015-05-02 DIAGNOSIS — C259 Malignant neoplasm of pancreas, unspecified: Secondary | ICD-10-CM

## 2015-05-02 DIAGNOSIS — C772 Secondary and unspecified malignant neoplasm of intra-abdominal lymph nodes: Secondary | ICD-10-CM | POA: Diagnosis not present

## 2015-05-02 HISTORY — DX: Malignant neoplasm of pancreas, unspecified: C25.9

## 2015-05-02 NOTE — Progress Notes (Signed)
Marland Kitchen    HEMATOLOGY/ONCOLOGY CONSULTATION NOTE  Date of Service: 05/02/2015  Patient Care Team: Rosalita Chessman, DO as PCP - General (Family Medicine)  CHIEF COMPLAINTS/PURPOSE OF CONSULTATION:  Metastatic pancreatic cancer  HISTORY OF PRESENTING ILLNESS:   Michelle Noble is a wonderful 82 y.o. female who has been referred to [place] by Dr .Garnet Koyanagi, DO for evaluation and management of suspected metastatic pancreatic cancer.  Patient has a history of hypertension, dyslipidemia with advanced Alzheimer's dementia who had presented to her primary care physician on 04/04/2015 with left-sided abdominal pain for a month. Her pain persisted and therefore her primary care physician and got a CT of the abdomen on 04/16/2015 which showed an irregular hypoenhancing 3.2 x 2.5 x 2.7 cm pancreatic mass in the mid to distal pancreatic body with pancreatic ductal dilatation and vascular encasement consistent with primary pancreatic adenocarcinoma. Was also noted to have gastrohepatic ligament lymphadenopathy which was likely metastatic. No liver metastases noted. CT of the chest was not done at the time of this clinic visit. Patient has not had a biopsy of her pancreatic lesion. She is a limited historian but is accompanied by her family. It is noted that Tyajah Biddick is a financial power of attorney and Pama Faro is her healthcare power of attorney.  We had a detailed discussion of the patient's radiological findings and symptoms. Patient and her family were quite clear that they wanted to pursue no aggressive diagnostic or therapeutic interventions and preferred to focus on comfort cares. Hospice referral given through [facility] palliative care and hospice services.  Patient is noted to have decreased appetite and unquantifiable weight loss.    MEDICAL HISTORY:  Past Medical History  Diagnosis Date  . Frequent headaches   . High blood pressure   . Hyperlipidemia   . Vitamin D deficiency   .  Hypokalemia   . GERD (gastroesophageal reflux disease)   . Memory loss   . Late onset Alzheimer's disease with behavioral disturbance 03/26/2015    SURGICAL HISTORY: Past Surgical History  Procedure Laterality Date  . Appendectomy      SOCIAL HISTORY: Social History   Social History  . Marital Status: Widowed    Spouse Name: N/A  . Number of Children: N/A  . Years of Education: N/A   Occupational History  . Not on file.   Social History Main Topics  . Smoking status: Never Smoker   . Smokeless tobacco: Former Systems developer  . Alcohol Use: No  . Drug Use: No  . Sexual Activity: No   Other Topics Concern  . Not on file   Social History Narrative    FAMILY HISTORY: Family History  Problem Relation Age of Onset  . Arthritis Maternal Grandmother     Maternal family  . Arthritis Mother     ALLERGIES:  has No Known Allergies.  MEDICATIONS:  Current Outpatient Prescriptions  Medication Sig Dispense Refill  . aspirin 81 MG tablet Take 81 mg by mouth daily.    Marland Kitchen atorvastatin (LIPITOR) 20 MG tablet TAKE ONE TABLET BY MOUTH AT BEDTIME 30 tablet 5  . azelastine (ASTELIN) 137 MCG/SPRAY nasal spray Place 1 spray into the nose 2 (two) times daily. Use in each nostril as directed    . cetirizine (ZYRTEC) 10 MG tablet Take 10 mg by mouth daily.    . Cholecalciferol (VITAMIN D) 2000 UNITS CAPS Take by mouth.    . ciprofloxacin (CIPRO) 250 MG tablet Take 1 tablet (250 mg total) by mouth  2 (two) times daily. 6 tablet 0  . furosemide (LASIX) 20 MG tablet TAKE ONE TABLET BY MOUTH ONE TIME DAILY 30 tablet 5  . HYDROcodone-acetaminophen (NORCO/VICODIN) 5-325 MG tablet Take 1 tablet by mouth every 4 (four) hours as needed for moderate pain. 120 tablet 0  . memantine (NAMENDA) 10 MG tablet Take 1 tablet (10 mg total) by mouth 2 (two) times daily. 60 tablet 8  . metoprolol succinate (TOPROL-XL) 50 MG 24 hr tablet TAKE 1 TABLET BY MOUTH DAILY. TAKE WITH OR IMMEDIATELY FOLLOWING A MEAL. 90 tablet  1  . mirtazapine (REMERON) 15 MG tablet TAKE 1 TABLET AT BEDTIME 30 tablet 5  . omeprazole (PRILOSEC) 20 MG capsule Take 1 capsule (20 mg total) by mouth daily. 30 capsule 3  . potassium chloride (K-DUR,KLOR-CON) 10 MEQ tablet TAKE ONE TABLET BY MOUTH TWICE DAILY 60 tablet 5  . valsartan (DIOVAN) 80 MG tablet Take 1 tablet (80 mg total) by mouth daily. 30 tablet 5   No current facility-administered medications for this visit.    REVIEW OF SYSTEMS:    10 Point review of Systems was done is negative except as noted above.  PHYSICAL EXAMINATION: ECOG PERFORMANCE STATUS: 3 - Symptomatic, >50% confined to bed  . Filed Vitals:   05/02/15 1413  BP: 141/71  Pulse: 85  Temp: 98.3 F (36.8 C)  Resp: 16   Filed Weights   05/02/15 1413  Weight: 135 lb 8 oz (61.462 kg)   .Body mass index is 24.78 kg/(m^2).  GENERAL: An elderly frail appearing 82 year old patient SKIN: No acute rashes  OROPHARYNX: Oral mucosa moist NECK: supple LYMPH:  no palpable lymphadenopathy in the cervical, axillary or inguinal LUNGS: clear to auscultation with normal respiratory effort HEART: regular rate & rhythm,  no murmurs and no lower extremity edema ABDOMEN: abdomen mildly distended . Bowel sounds present . No guarding rigidity or rebound . Musculoskeletal: Trace pedal edema PSYCH: alert orientation difficult to define NEURO: No moving all 4 extremities  LABORATORY DATA:  I have reviewed the data as listed Previous labs reviewed. No new labs done today.  RADIOGRAPHIC STUDIES: I have personally reviewed the radiological images as listed and agreed with the findings in the report. US Abdomen Complete  04/12/2015  CLINICAL DATA:  LEFT-sided abdominal pain for 3 weeks off and on, burning pelvic and LEFT flank pain, treated for UTI last week EXAM: ABDOMEN ULTRASOUND COMPLETE COMPARISON:  None FINDINGS: Gallbladder: Distended without shadowing calculi, wall thickening, pericholecystic fluid, or sonographic  Murphy sign. Common bile duct: Diameter: Dilated up to 14 mm diameter. CBD dilatation extends to the pancreas. Minimal central intrahepatic biliary dilatation. Liver: Normal parenchymal echogenicity without focal mass lesion. Hepatopetal portal venous flow. Minimal intrahepatic biliary dilatation centrally. IVC: Normal appearance Pancreas: Head of pancreas partially obscured by bowel gas. Body of pancreas grossly unremarkable. Hypoechoic mass at proximal tail of pancreas, 3.8 x 2.6 x 3.3 cm in size, predominately solid hypoechoic with an adjacent multiloculated cystic area up to 3.3 cm in size. Finding is highly concerning for a pancreatic neoplasm. Distal tail of pancreas obscured. Spleen: Small in size, 4.4 cm length without focal abnormality Right Kidney: Length: 9.5 cm. Cortical atrophy. Multiple cysts up to 3.8 cm diameter. No definite hydronephrosis or shadowing calcification. Left Kidney: Length: 9.4 cm. Normal cortical thickness and echogenicity. No definite mass or hydronephrosis. Abdominal aorta: Scattered atherosclerotic changes without aneurysmal dilatation. Other findings: No free fluid IMPRESSION: Mass at proximal tail of pancreas 3.8 x 2.6 x 3.3  cm in size with an adjacent multiloculated cystic area up to 3.3 cm in size, raising question of pancreatic neoplasm. Additionally, patient demonstrates CBD dilatation up to 14 mm diameter with a minimal central intrahepatic biliary dilatation, unable to exclude a distal obstructing lesion. Follow-up CT or MR imaging with and without contrast recommended to evaluate. Findings called to Dr. Etter Sjogren on 04/12/2015 at 1730 hours. Electronically Signed   By: Lavonia Dana M.D.   On: 04/12/2015 17:32   Ct Abd Wo & W Cm  04/16/2015  CLINICAL DATA:  Pancreatic tail mass on recent abdominal sonogram. EXAM: CT ABDOMEN WITHOUT AND WITH CONTRAST TECHNIQUE: Multidetector CT imaging of the abdomen was performed following the standard protocol before and following the bolus  administration of intravenous contrast. CONTRAST:  166mL OMNIPAQUE IOHEXOL 350 MG/ML SOLN COMPARISON:  04/12/2015 abdominal sonogram. FINDINGS: Lower chest: Lingular 3 mm solid pulmonary nodule (series 7/image 1). Hepatobiliary: Normal liver with no liver mass. Normal gallbladder with no radiopaque cholelithiasis. There is mild central intrahepatic biliary ductal dilatation. The common bile is prominently dilated with a diameter of 15 mm proximally, with smooth distal tapering of the common bile duct, with no radiopaque choledocholithiasis or appreciable biliary or ampullary mass. Pancreas: There is an irregularly marginated 3.2 x 2.5 x 2.9 cm pancreatic mass in the mid to distal pancreatic body (series 4/ image 24), which hypoenhances relative to the pancreatic parenchyma, and which is associated with diffuse mild main pancreatic duct dilation (4 mm main pancreatic duct diameter). The mass appears to encase approximately 40% of the proximal superior mesenteric artery circumference anteriorly. The mass encases and mildly narrows the proximal splenic artery. The hepatic artery and main portal vein appear uninvolved by the mass. The splenic vein appears encased and occluded by the mass, with peripancreatic venous collaterals. Spleen: Normal size. No mass. Adrenals/Urinary Tract: Normal adrenals. No hydronephrosis. Simple 2.2 cm renal cyst in the posterior upper right kidney. Simple 3.0 cm renal cyst in the lower right kidney. Simple 1.4 cm renal cyst in the posterior lower right kidney. Simple 2.3 cm, 2.0 cm and 1.7 cm renal cysts in the lower left kidney. Additional subcentimeter hypodense renal cortical lesions in both kidneys, too small to characterize. No renal stones. Stomach/Bowel: Grossly normal stomach. Visualized small and large bowel is normal caliber, with no bowel wall thickening. Vascular/Lymphatic: Atherosclerotic nonaneurysmal abdominal aorta. Patent hepatic, portal and renal veins. Mildly enlarged 1.0  cm gastrohepatic ligament lymph node (series 4/ image 21). Other: No pneumoperitoneum, ascites or focal fluid collection. Coarsely calcified 1.2 cm hypodense mass in the posterior uterus, likely calcific degeneration of a small uterine fibroid. No adnexal mass. Musculoskeletal: No aggressive appearing focal osseous lesions. Marked degenerative changes in the visualized thoracolumbar spine. IMPRESSION: 1. Irregular hypoenhancing 3.2 x 2.5 x 2.9 cm pancreatic mass in the mid to distal pancreatic body with associated mild main pancreatic duct dilation and vascular encasement as described. Findings are most in keeping with a primary pancreatic adenocarcinoma. 2. Mild gastrohepatic ligament lymphadenopathy, likely metastatic. No liver metastases. 3. Solitary 3 mm pulmonary nodule in the lingula. Recommend follow-up chest CT in 3 months, if clinically warranted. 4. Biliary ductal dilatation with smooth tapering of the distal common bile duct and no radiopaque choledocholithiasis or appreciable biliary or ampullary mass. Findings could indicate an ampullary stricture. Recommend correlation with serum bilirubin levels. If the serum bilirubin level is elevated, consider further evaluation with MRI abdomen with MRCP and with and without intravenous contrast. Electronically Signed   By: Corene Cornea  A Poff M.D.   On: 04/16/2015 16:40    ASSESSMENT & PLAN:   82 year old elderly frail lady with history of significant of them his dementia with behavioral changes with   1)  Newly diagnosed likely pancreatic adenocarcinoma based on CT abdomen findings. Patient has a 3.2 x 2.5 x 2.9 cm pancreatic mass in the mid to distal pancreatic body.  The mass appears to encase approximately 40% of the proximal superior mesenteric artery circumference anteriorly. The mass encases and mildly narrows the proximal splenic artery. The hepatic artery and main portal vein appear uninvolved by the mass. The splenic vein appears encased and occluded  by the mass, with peripancreatic venous collaterals. Also noted to have metastatic gastrohepatic ligament lymphadenopathy.  The mass is unresectable and likely metastatic. Patient is a poor candidate for any surgical intervention or palliative chemotherapy given her performance status advanced dementia with behavioral issues.  Plan -I spent a significant period of time with the patient and her accompanying family defining the available imaging studies and the likely diagnostic possibility of metastatic pancreatic adenocarcinoma. -We discussed the workup required to make a definitive diagnosis including a biopsy of the pancreatic mass and doing the whole body staging CT scan or PET scan. -The patient's HCA and other family members are unanimous in that understanding that the patient would not want any aggressive diagnostic or treatment intervention at this time which is quite reasonable. -The want to focus on keeping the patient comfortable and are open to the idea of enrolling in hospice at home. -Referral was given for home hospice evaluation through [place] [facility] and hospice services. -I informed them that though we will not set up any specific oncology follow-up at this time that I would be available to answer any questions and help direct any other cares that might be needed.   All of the patients/family's questions were answered  in details to their apparent satisfaction. The patient knows to call the clinic with any problems, questions or concerns.   . Orders Placed This Encounter  Procedures  . Ambulatory referral to Hospice    Referral Priority:  Routine    Referral Type:  Consultation    Referral Reason:  Specialty Services Required    Requested Specialty:  Hospice Services    Number of Visits Requested:  1    I spent 60 minutes counseling the patient face to face. The total time spent in the appointment was 60 minutes and more than 50% was on counseling and direct  patient cares.    Sullivan Lone MD [place] AAHIVMS [facility] Hematology/Oncology Physician [facility]  (Office):       [phone] (Work cell):  [phone] (Fax):           [phone]  05/02/2015 2:18 PM

## 2015-05-06 ENCOUNTER — Telehealth: Payer: Self-pay | Admitting: *Deleted

## 2015-05-06 NOTE — Telephone Encounter (Signed)
Received [facility] paperwork to be signed; forwarded to provider/SLS 01/16

## 2015-05-07 ENCOUNTER — Telehealth: Payer: Self-pay

## 2015-05-07 MED ORDER — OXYCODONE-ACETAMINOPHEN 5-325 MG PO TABS: 1.0000 | ORAL_TABLET | ORAL | Status: DC | PRN

## 2015-05-07 MED ORDER — MORPHINE SULFATE ER 15 MG PO TBCR: 15.0000 mg | EXTENDED_RELEASE_TABLET | Freq: Two times a day (BID) | ORAL | Status: DC

## 2015-05-07 NOTE — Telephone Encounter (Signed)
noted 

## 2015-05-07 NOTE — Telephone Encounter (Signed)
Call from [facility] [phone] and she stated the patient is in severe pain and has been taking 10 Hydrocodone daily ad it is not helping with the pain. She wanted to know if we could write a script for MS contin 15 er q 12 h and Oxycodone 5-325 for break through pain. Ok per [employer]. Rx faxed and the phamarcy advised Hospice normally only covers 14 days but she will run it for 30 to see if it goes through. I advised I would make MD aware.    KP

## 2015-05-20 ENCOUNTER — Emergency Department (HOSPITAL_COMMUNITY)

## 2015-05-20 ENCOUNTER — Encounter (HOSPITAL_COMMUNITY): Payer: Self-pay | Admitting: Emergency Medicine

## 2015-05-20 ENCOUNTER — Inpatient Hospital Stay (HOSPITAL_COMMUNITY)
Admission: EM | Admit: 2015-05-20 | Discharge: 2015-05-23 | DRG: 871 | Disposition: A | Discharge status: Hospice | Attending: Internal Medicine | Admitting: Internal Medicine

## 2015-05-20 DIAGNOSIS — C772 Secondary and unspecified malignant neoplasm of intra-abdominal lymph nodes: Secondary | ICD-10-CM | POA: Diagnosis present

## 2015-05-20 DIAGNOSIS — C259 Malignant neoplasm of pancreas, unspecified: Secondary | ICD-10-CM | POA: Diagnosis present

## 2015-05-20 DIAGNOSIS — K219 Gastro-esophageal reflux disease without esophagitis: Secondary | ICD-10-CM | POA: Diagnosis present

## 2015-05-20 DIAGNOSIS — R509 Fever, unspecified: Secondary | ICD-10-CM

## 2015-05-20 DIAGNOSIS — A419 Sepsis, unspecified organism: Secondary | ICD-10-CM | POA: Insufficient documentation

## 2015-05-20 DIAGNOSIS — A4151 Sepsis due to Escherichia coli [E. coli]: Secondary | ICD-10-CM | POA: Diagnosis not present

## 2015-05-20 DIAGNOSIS — Z66 Do not resuscitate: Secondary | ICD-10-CM | POA: Diagnosis present

## 2015-05-20 DIAGNOSIS — R410 Disorientation, unspecified: Secondary | ICD-10-CM | POA: Diagnosis not present

## 2015-05-20 DIAGNOSIS — F0280 Dementia in other diseases classified elsewhere without behavioral disturbance: Secondary | ICD-10-CM | POA: Diagnosis present

## 2015-05-20 DIAGNOSIS — R413 Other amnesia: Secondary | ICD-10-CM | POA: Diagnosis present

## 2015-05-20 DIAGNOSIS — F0390 Unspecified dementia without behavioral disturbance: Secondary | ICD-10-CM | POA: Diagnosis present

## 2015-05-20 DIAGNOSIS — Z792 Long term (current) use of antibiotics: Secondary | ICD-10-CM

## 2015-05-20 DIAGNOSIS — R627 Adult failure to thrive: Secondary | ICD-10-CM | POA: Diagnosis present

## 2015-05-20 DIAGNOSIS — Z8261 Family history of arthritis: Secondary | ICD-10-CM

## 2015-05-20 DIAGNOSIS — R9389 Abnormal findings on diagnostic imaging of other specified body structures: Secondary | ICD-10-CM

## 2015-05-20 DIAGNOSIS — R4182 Altered mental status, unspecified: Secondary | ICD-10-CM

## 2015-05-20 DIAGNOSIS — G9340 Encephalopathy, unspecified: Secondary | ICD-10-CM | POA: Diagnosis present

## 2015-05-20 DIAGNOSIS — K831 Obstruction of bile duct: Secondary | ICD-10-CM | POA: Diagnosis present

## 2015-05-20 DIAGNOSIS — R109 Unspecified abdominal pain: Secondary | ICD-10-CM | POA: Diagnosis present

## 2015-05-20 DIAGNOSIS — Z515 Encounter for palliative care: Secondary | ICD-10-CM | POA: Diagnosis present

## 2015-05-20 DIAGNOSIS — E559 Vitamin D deficiency, unspecified: Secondary | ICD-10-CM | POA: Diagnosis present

## 2015-05-20 DIAGNOSIS — R7989 Other specified abnormal findings of blood chemistry: Secondary | ICD-10-CM | POA: Diagnosis present

## 2015-05-20 DIAGNOSIS — F0281 Dementia in other diseases classified elsewhere with behavioral disturbance: Secondary | ICD-10-CM | POA: Diagnosis present

## 2015-05-20 DIAGNOSIS — E785 Hyperlipidemia, unspecified: Secondary | ICD-10-CM | POA: Diagnosis present

## 2015-05-20 DIAGNOSIS — G301 Alzheimer's disease with late onset: Secondary | ICD-10-CM | POA: Diagnosis present

## 2015-05-20 DIAGNOSIS — J9811 Atelectasis: Secondary | ICD-10-CM | POA: Diagnosis present

## 2015-05-20 DIAGNOSIS — R945 Abnormal results of liver function studies: Secondary | ICD-10-CM | POA: Diagnosis present

## 2015-05-20 DIAGNOSIS — I10 Essential (primary) hypertension: Secondary | ICD-10-CM | POA: Diagnosis present

## 2015-05-20 DIAGNOSIS — E872 Acidosis: Secondary | ICD-10-CM | POA: Diagnosis present

## 2015-05-20 DIAGNOSIS — G309 Alzheimer's disease, unspecified: Secondary | ICD-10-CM

## 2015-05-20 DIAGNOSIS — Z79891 Long term (current) use of opiate analgesic: Secondary | ICD-10-CM

## 2015-05-20 DIAGNOSIS — R7881 Bacteremia: Secondary | ICD-10-CM | POA: Insufficient documentation

## 2015-05-20 DIAGNOSIS — Z79899 Other long term (current) drug therapy: Secondary | ICD-10-CM

## 2015-05-20 LAB — COMPREHENSIVE METABOLIC PANEL
ALT: 208 U/L — ABNORMAL HIGH (ref 14–54)
AST: 378 U/L — ABNORMAL HIGH (ref 15–41)
Albumin: 2.9 g/dL — ABNORMAL LOW (ref 3.5–5.0)
Alkaline Phosphatase: 506 U/L — ABNORMAL HIGH (ref 38–126)
Anion gap: 11 (ref 5–15)
BUN: 14 mg/dL (ref 6–20)
CO2: 24 mmol/L (ref 22–32)
Calcium: 8.1 mg/dL — ABNORMAL LOW (ref 8.9–10.3)
Chloride: 106 mmol/L (ref 101–111)
Creatinine, Ser: 1.12 mg/dL — ABNORMAL HIGH (ref 0.44–1.00)
GFR calc Af Amer: 48 mL/min — ABNORMAL LOW (ref >60)
GFR calc non Af Amer: 41 mL/min — ABNORMAL LOW (ref >60)
Glucose, Bld: 149 mg/dL — ABNORMAL HIGH (ref 65–99)
Potassium: 5.4 mmol/L — ABNORMAL HIGH (ref 3.5–5.1)
Sodium: 141 mmol/L (ref 135–145)
Total Bilirubin: 2.2 mg/dL — ABNORMAL HIGH (ref 0.3–1.2)
Total Protein: 7.3 g/dL (ref 6.5–8.1)

## 2015-05-20 LAB — CBC WITH DIFFERENTIAL/PLATELET
BASOS PCT: 0 %
Basophils Absolute: 0 10*3/uL (ref 0.0–0.1)
EOS ABS: 0 10*3/uL (ref 0.0–0.7)
EOS PCT: 0 %
HCT: 30.5 % — ABNORMAL LOW (ref 36.0–46.0)
Hemoglobin: 10 g/dL — ABNORMAL LOW (ref 12.0–15.0)
LYMPHS ABS: 1.5 10*3/uL (ref 0.7–4.0)
Lymphocytes Relative: 10 %
MCH: 22.5 pg — AB (ref 26.0–34.0)
MCHC: 32.8 g/dL (ref 30.0–36.0)
MCV: 68.5 fL — AB (ref 78.0–100.0)
MONO ABS: 1.4 10*3/uL — AB (ref 0.1–1.0)
Monocytes Relative: 9 %
Neutro Abs: 12.1 10*3/uL — ABNORMAL HIGH (ref 1.7–7.7)
Neutrophils Relative %: 81 %
PLATELETS: 353 10*3/uL (ref 150–400)
RBC: 4.45 MIL/uL (ref 3.87–5.11)
RDW: 15.2 % (ref 11.5–15.5)
WBC: 15 10*3/uL — ABNORMAL HIGH (ref 4.0–10.5)

## 2015-05-20 LAB — I-STAT CG4 LACTIC ACID, ED: LACTIC ACID, VENOUS: 2.32 mmol/L — AB (ref 0.5–2.0)

## 2015-05-20 LAB — URINALYSIS, ROUTINE W REFLEX MICROSCOPIC
GLUCOSE, UA: NEGATIVE mg/dL
HGB URINE DIPSTICK: NEGATIVE
KETONES UR: NEGATIVE mg/dL
Nitrite: NEGATIVE
PROTEIN: 30 mg/dL — AB
Specific Gravity, Urine: 1.022 (ref 1.005–1.030)
pH: 6 (ref 5.0–8.0)

## 2015-05-20 LAB — URINE MICROSCOPIC-ADD ON

## 2015-05-20 LAB — I-STAT BETA HCG BLOOD, ED (MC, WL, AP ONLY): I-stat hCG, quantitative: <5 m[IU]/mL (ref <5)

## 2015-05-20 MED ORDER — PIPERACILLIN-TAZOBACTAM 3.375 G IVPB: 3.3750 g | Freq: Three times a day (TID) | INTRAVENOUS | Status: DC

## 2015-05-20 MED ORDER — SODIUM CHLORIDE 0.9 % IV SOLN
1000.0000 mL | INTRAVENOUS | Status: DC
  Administered 2015-05-20: 1000 mL via INTRAVENOUS

## 2015-05-20 MED ORDER — SODIUM CHLORIDE 0.9 % IV BOLUS (SEPSIS)
1000.0000 mL | INTRAVENOUS | Status: DC
  Administered 2015-05-20: 1000 mL via INTRAVENOUS

## 2015-05-20 MED ORDER — IOHEXOL 300 MG/ML  SOLN
75.0000 mL | Freq: Once | INTRAMUSCULAR | Status: AC | PRN
  Administered 2015-05-20: 75 mL via INTRAVENOUS

## 2015-05-20 MED ORDER — ACETAMINOPHEN 650 MG RE SUPP
650.0000 mg | Freq: Once | RECTAL | Status: DC
  Filled 2015-05-20: qty 1

## 2015-05-20 MED ORDER — PIPERACILLIN-TAZOBACTAM 3.375 G IVPB 30 MIN
3.3750 g | Freq: Once | INTRAVENOUS | Status: AC
  Administered 2015-05-20: 3.375 g via INTRAVENOUS
  Filled 2015-05-20: qty 50

## 2015-05-20 MED ORDER — VANCOMYCIN HCL IN DEXTROSE 1-5 GM/200ML-% IV SOLN
1000.0000 mg | Freq: Once | INTRAVENOUS | Status: AC
  Administered 2015-05-20: 1000 mg via INTRAVENOUS
  Filled 2015-05-20: qty 200

## 2015-05-20 MED ORDER — VANCOMYCIN HCL IN DEXTROSE 750-5 MG/150ML-% IV SOLN: 750.0000 mg | INTRAVENOUS | Status: DC

## 2015-05-20 MED ORDER — MORPHINE SULFATE (PF) 2 MG/ML IV SOLN
2.0000 mg | Freq: Once | INTRAVENOUS | Status: AC
  Administered 2015-05-20: 2 mg via INTRAVENOUS
  Filled 2015-05-20: qty 1

## 2015-05-20 NOTE — ED Provider Notes (Signed)
CSN: [id number]     Arrival date & time 05/20/15  1851 History   First MD Initiated Contact with Patient 05/20/15 1859     Chief Complaint  Patient presents with  . Fever     (Consider location/radiation/quality/duration/timing/severity/associated sxs/prior Treatment) HPI   Pt with hx dementia, pancreatic cancer, HTN, HLD p/w fever.  Pt from home.  Per EMS family thinks pt is more altered than usual.  Daughters arrived after workup and treatment initiated - they state pt was diagnosed with pancreatic cnacer on Dec 23, is in hospice, living at home.  About 1.5 weeks ago pt stopped taking her medication, wouldn't eat or drink, was shaking.  Today she repeated this and additionally woke up early and acted bizarrely, moving around, looking at things, was argumentative. She took a long nap and woke up lethargic and shaking.  Over the past few weeks has been eating and drinking less, has become less ambulatory.    Level V caveat for dementia.   Past Medical History  Diagnosis Date  . Frequent headaches   . High blood pressure   . Hyperlipidemia   . Vitamin D deficiency   . Hypokalemia   . GERD (gastroesophageal reflux disease)   . Memory loss   . Late onset Alzheimer's disease with behavioral disturbance 03/26/2015  . Pancreatic cancer metastasized to intra-abdominal lymph node ([place]) 05/02/2015   Past Surgical History  Procedure Laterality Date  . Appendectomy     Family History  Problem Relation Age of Onset  . Arthritis Maternal Grandmother     Maternal family  . Arthritis Mother    Social History  Substance Use Topics  . Smoking status: Never Smoker   . Smokeless tobacco: Former Systems developer  . Alcohol Use: No   OB History    No data available     Review of Systems  Unable to perform ROS: Dementia      Allergies  Review of patient's allergies indicates no known allergies.  Home Medications   Prior to Admission medications   Medication Sig Start Date End Date Taking?  Authorizing Provider  aspirin 81 MG tablet Take 81 mg by mouth daily.    Historical Provider, MD  atorvastatin (LIPITOR) 20 MG tablet TAKE ONE TABLET BY MOUTH AT BEDTIME 04/08/15   Alferd Apa Lowne, DO  azelastine (ASTELIN) 137 MCG/SPRAY nasal spray Place 1 spray into the nose 2 (two) times daily. Use in each nostril as directed    Historical Provider, MD  cetirizine (ZYRTEC) 10 MG tablet Take 10 mg by mouth daily.    Historical Provider, MD  Cholecalciferol (VITAMIN D) 2000 UNITS CAPS Take by mouth.    Historical Provider, MD  ciprofloxacin (CIPRO) 250 MG tablet Take 1 tablet (250 mg total) by mouth 2 (two) times daily. 04/04/15   Rosalita Chessman, DO  furosemide (LASIX) 20 MG tablet TAKE ONE TABLET BY MOUTH ONE TIME DAILY 04/08/15   Rosalita Chessman, DO  memantine (NAMENDA) 10 MG tablet Take 1 tablet (10 mg total) by mouth 2 (two) times daily. 04/08/15 01/07/16  Pieter Partridge, DO  metoprolol succinate (TOPROL-XL) 50 MG 24 hr tablet TAKE 1 TABLET BY MOUTH DAILY. TAKE WITH OR IMMEDIATELY FOLLOWING A MEAL. 04/16/15   Rosalita Chessman, DO  mirtazapine (REMERON) 15 MG tablet TAKE 1 TABLET AT BEDTIME 04/08/15   Rosalita Chessman, DO  morphine (MS CONTIN) 15 MG 12 hr tablet Take 1 tablet (15 mg total) by mouth every 12 (twelve) hours.  05/07/15   Rosalita Chessman, DO  omeprazole (PRILOSEC) 20 MG capsule Take 1 capsule (20 mg total) by mouth daily. 04/04/15   Rosalita Chessman, DO  oxyCODONE-acetaminophen (ROXICET) 5-325 MG tablet Take 1 tablet by mouth as needed for severe pain. For breakthrough pain 05/07/15   Rosalita Chessman, DO  potassium chloride (K-DUR,KLOR-CON) 10 MEQ tablet TAKE ONE TABLET BY MOUTH TWICE DAILY 02/25/15   Rosalita Chessman, DO  valsartan (DIOVAN) 80 MG tablet Take 1 tablet (80 mg total) by mouth daily. 02/05/15   Yvonne R Lowne, DO   BP 131/64 mmHg  Pulse 115  Temp(Src) 101.8 F (38.8 C) (Rectal)  Resp 15  SpO2 94% Physical Exam  Constitutional: She appears well-developed and well-nourished. No  distress.  HENT:  Head: Normocephalic and atraumatic.  Eyes: Conjunctivae are normal.  Neck: Neck supple.  Cardiovascular: Regular rhythm.  Tachycardia present.   Pulmonary/Chest: Effort normal. No respiratory distress. She has decreased breath sounds. She has no wheezes. She has no rales.  Abdominal: Soft. She exhibits no distension. There is no tenderness. There is no rebound and no guarding.  Musculoskeletal: She exhibits no edema.  Neurological: She is alert.  Pt appears to be holding liquid in her mouth.  Does not follow commands or respond to questioning.   Skin: She is not diaphoretic.  Nursing note and vitals reviewed.   ED Course  Procedures (including critical care time) Labs Review Labs Reviewed  CBC WITH DIFFERENTIAL/PLATELET - Abnormal; Notable for the following:    WBC 15.0 (*)    Hemoglobin 10.0 (*)    HCT 30.5 (*)    MCV 68.5 (*)    MCH 22.5 (*)    Neutro Abs 12.1 (*)    Monocytes Absolute 1.4 (*)    All other components within normal limits  URINALYSIS, ROUTINE W REFLEX MICROSCOPIC (NOT AT [facility]) - Abnormal; Notable for the following:    Color, Urine AMBER (*)    APPearance CLOUDY (*)    Bilirubin Urine MODERATE (*)    Protein, ur 30 (*)    Leukocytes, UA TRACE (*)    All other components within normal limits  COMPREHENSIVE METABOLIC PANEL - Abnormal; Notable for the following:    Potassium 5.4 (*)    Glucose, Bld 149 (*)    Creatinine, Ser 1.12 (*)    Calcium 8.1 (*)    Albumin 2.9 (*)    AST 378 (*)    ALT 208 (*)    Alkaline Phosphatase 506 (*)    Total Bilirubin 2.2 (*)    GFR calc non Af Amer 41 (*)    GFR calc Af Amer 48 (*)    All other components within normal limits  URINE MICROSCOPIC-ADD ON - Abnormal; Notable for the following:    Squamous Epithelial / LPF 0-5 (*)    Bacteria, UA RARE (*)    All other components within normal limits  I-STAT CG4 LACTIC ACID, ED - Abnormal; Notable for the following:    Lactic Acid, Venous 2.32 (*)    All  other components within normal limits  CULTURE, BLOOD (ROUTINE X 2)  CULTURE, BLOOD (ROUTINE X 2)  URINE CULTURE  I-STAT BETA HCG BLOOD, ED ([facility], [facility], [facility] ONLY)  I-STAT CG4 LACTIC ACID, ED    Imaging Review Dg Chest Port 1 View  05/20/2015  CLINICAL DATA:  82 year old female with sepsis and fever. Altered mental status. EXAM: PORTABLE CHEST 1 VIEW COMPARISON:  Frontal and lateral views 07/20/2013  FINDINGS: Lung volumes are low leading to crowding of bronchovascular structures. Questionable vascular congestion versus bronchovascular crowding. Mild cardiomegaly is unchanged. Minimal right infrahilar airspace opacity. There is a rounded 1.2 cm air density structure projecting over the mediastinum just superior to the left mainstem bronchus of uncertain significance. No large pleural effusion. No pneumothorax. Degenerative change noted of both shoulders. IMPRESSION: 1. Mild right infrahilar airspace opacity, appearance favors atelectasis. Superimposed mild pneumonia not excluded. 2. Air density structure projecting over the mediastinum superior to the left mainstem bronchus, significance uncertain. This could be further characterized with chest CT. 3. Mild cardiomegaly. Vascular congestion versus crowding secondary to low lung volumes. Electronically Signed   By: Jeb Levering M.D.   On: 05/20/2015 19:52      EKG Interpretation None       7:16 PM Dr Wilson Singer made aware of patient.    11:14 PM I spoke with Dr Jonnie Finner who will admit the patient for [facility].    MDM   Final diagnoses:  Fever  LFT elevation  Abnormal chest x-ray  Sepsis, due to unspecified organism [facility])    Elderly female with fever, tachycardia that began today.  Pt has pancreatic cancer and is in hospice.  Pt unable to give any information.  Family denies any known symptoms other than quickly decreased ability to function, eat/drink, ambulate.  Complains about abdominal pain but left sided pain.  Pt immediately  started on sepsis protocol with unknown source of infection (Vanc/Zosyn).  Labs reflect lactic acidosis, leukocytosis, chronic renal dysfunction, new elevation in LFTs.  CXR is abnormal, suspect pneumonia as source of sepsis.  UA is unremarkable.  LFTs are newly elevated though pt recently diagnosed with advanced pancreatic cancer.  LFT elevation may be due to obstruction vs possible cholangitis.  Discussed pt, workup, and plan with Dr Wilson Singer who has also seen the patient.  Pending CT chest and RUQ abdominal US at time of admission to delineate this further.  Dr Jonnie Finner agrees to admission.       Clayton Bibles, PA-C 05/21/15 [place], PA-C 05/21/15 0030  Virgel Manifold, MD 05/22/15 [phone]

## 2015-05-20 NOTE — Progress Notes (Signed)
Rx Brief Abx note:  IV Zosyn/Vancomycin  See 1/30 note by Karel Jarvis for details  Asessement:  Scr=1.12 > CrCl~35 (N)  Plan:  Zosyn 3.375 gm IV q8h EI  Vancomycin 1Gm x1 then 750mg  IV q24h  F/u SCr/cultures/levels as needed  Dorrene German 05/20/2015 10:42 PM

## 2015-05-20 NOTE — ED Notes (Signed)
Per EMS pt come from home with fever.  Started around 5 this evening.  102.1Axillary at home and EMS 101.3 tympanic.  Family feels like pt may be a little altered.

## 2015-05-20 NOTE — H&P (Signed)
[facility] History and Physical  Michelle Noble [id number] DOB: [birth year] DOA: 05/20/2015  Referring physician: Dr Wilson Singer  PCP: Garnet Koyanagi, DO   Chief Complaint: Confusion, not eating, lethargic and shaking  HPI: Michelle Noble is a 82 y.o. female with hx of HTN, HL, Alzhemier's dementia who was dx'd with pancreatic cancer in December 2016.  She is on hospice care at home.  For the last 10 days or so patient has not been eating or taking her medications.  Last 24 hours has been acting strangely, arguing some. She was difficult to wake from a nap.  Less able to ambulate.  She was brought to ED where temp was 101.8, BP 120/68, HR 88, R 18-22 and WBC 15K.  Also AST 378 and ALT 208 w Tbili 2.2.  Alkphos 506.  Creat 1.12.  CO2 24.  Na 141 K 5.4.    Patient has hx of dementia and has been deteriorating per the notes in EPIC.  In Dec developed abd pain and abd US showed complex pancreatic mass with dilated hepatic ducts in the liver. She was seen by Oncology mid January and determined that she would go to hospice care, no other treatments planned.  Pt was referred to hospice at that time. Now patient is living with her daughter, on hospice at home.    The patient provides no history.  The two daughters here say that they have stopped all of her home medications except that which is needed for comfort.  They are requesting comfort care only for their mother and do not want a lot of tests/ investigations ordered.  Pain medication is all they are focused on as the patient is having severe abd pain according to the family members present.   Where does patient live with her dtr Can patient participate in ADLs? Yes prior to illness  Past Medical History  Past Medical History  Diagnosis Date  . Frequent headaches   . High blood pressure   . Hyperlipidemia   . Vitamin D deficiency   . Hypokalemia   . GERD (gastroesophageal reflux disease)   . Memory loss   . Late onset Alzheimer's disease  with behavioral disturbance 03/26/2015  . Pancreatic cancer metastasized to intra-abdominal lymph node ([place]) 05/02/2015   Past Surgical History  Past Surgical History  Procedure Laterality Date  . Appendectomy     Family History  Family History  Problem Relation Age of Onset  . Arthritis Maternal Grandmother     Maternal family  . Arthritis Mother    Social History  reports that she has never smoked. She has quit using smokeless tobacco. She reports that she does not drink alcohol or use illicit drugs. Allergies No Known Allergies Home medications Prior to Admission medications   Medication Sig Start Date End Date Taking? Authorizing Provider  furosemide (LASIX) 20 MG tablet TAKE ONE TABLET BY MOUTH ONE TIME DAILY Patient taking differently: Take one tablet by mouth daily as needed for swelling. 04/08/15  Yes Yvonne R Lowne, DO  morphine (MS CONTIN) 15 MG 12 hr tablet Take 1 tablet (15 mg total) by mouth every 12 (twelve) hours. Patient taking differently: Take 15 mg by mouth every 4 (four) hours as needed for pain.  05/07/15  Yes Yvonne R Lowne, DO  atorvastatin (LIPITOR) 20 MG tablet TAKE ONE TABLET BY MOUTH AT BEDTIME Patient not taking: Reported on 05/20/2015 04/08/15   Rosalita Chessman, DO  ciprofloxacin (CIPRO) 250 MG tablet Take 1 tablet (  250 mg total) by mouth 2 (two) times daily. Patient not taking: Reported on 05/20/2015 04/04/15   Rosalita Chessman, DO  memantine (NAMENDA) 10 MG tablet Take 1 tablet (10 mg total) by mouth 2 (two) times daily. Patient not taking: Reported on 05/20/2015 04/08/15 01/07/16  Pieter Partridge, DO  metoprolol succinate (TOPROL-XL) 50 MG 24 hr tablet TAKE 1 TABLET BY MOUTH DAILY. TAKE WITH OR IMMEDIATELY FOLLOWING A MEAL. Patient not taking: Reported on 05/20/2015 04/16/15   Rosalita Chessman, DO  mirtazapine (REMERON) 15 MG tablet TAKE 1 TABLET AT BEDTIME 04/08/15   Rosalita Chessman, DO  omeprazole (PRILOSEC) 20 MG capsule Take 1 capsule (20 mg total) by mouth  daily. Patient not taking: Reported on 05/20/2015 04/04/15   Rosalita Chessman, DO  oxyCODONE-acetaminophen (ROXICET) 5-325 MG tablet Take 1 tablet by mouth as needed for severe pain. For breakthrough pain Patient not taking: Reported on 05/20/2015 05/07/15   Rosalita Chessman, DO  potassium chloride (K-DUR,KLOR-CON) 10 MEQ tablet TAKE ONE TABLET BY MOUTH TWICE DAILY Patient not taking: Reported on 05/20/2015 02/25/15   Alferd Apa Lowne, DO  valsartan (DIOVAN) 80 MG tablet Take 1 tablet (80 mg total) by mouth daily. Patient not taking: Reported on 05/20/2015 02/05/15   Rosalita Chessman, DO   Liver Function Tests  Recent Labs Lab 05/20/15 2105  AST 378*  ALT 208*  ALKPHOS 506*  BILITOT 2.2*  PROT 7.3  ALBUMIN 2.9*   No results for input(s): LIPASE, AMYLASE in the last 168 hours. CBC  Recent Labs Lab 05/20/15 1932  WBC 15.0*  NEUTROABS 12.1*  HGB 10.0*  HCT 30.5*  MCV 68.5*  PLT 0000000   Basic Metabolic Panel  Recent Labs Lab 05/20/15 2105  NA 141  K 5.4*  CL 106  CO2 24  GLUCOSE 149*  BUN 14  CREATININE 1.12*  CALCIUM 8.1*     Filed Vitals:   05/20/15 2111 05/20/15 2130 05/20/15 2200 05/20/15 2230  BP: 111/59 108/58 107/60 121/68  Pulse: 93 94 91 87  Temp:      TempSrc:      Resp: 16 21 17 16   Height:      Weight:      SpO2: 96% 94% 96% 96%   Exam: Elderly AAF , mouth closed, confused, nonverbal, moving about , not in distress No rash, cyanosis or gangrene Sclera anicteric, throat clear No jvd Chest clear bilat RRR no mrg Abd +bs diffusely tender ,nondistended MS normal m.tone, no joint changes Ext no LE or UE edema, no ulcers or wounds Neuro is awake , nonverbal, moving ext x 4  CXR (independently reviewed) > some nodular infiltrates vs vessels R lower perihilar region, o/w negative  Home medications > was taking multiple home medications until about 2 wks ago when she stopped taking all her medications. Family switched her pain pills to Roxanol liquid w help  of home hospice.  She took the Roxanol until the last 1-2 days and now is refusing that.    WBC 15K   Hb 10  Plt 353 AST 378 ALT 208 Tbili 2.2  Alkphos 506   Creat 1.12  CO2 24  Na 141 K 5.4    Assessment: 1 Abd pain / pancreatic cancer - primary issue, pain control 2 Altered mental status - in patient w sig Alzheimer's dementia and recently diagnosed pancreatic cancer.   3 Fever - prob related to biliary obstruction 4 Pancreatic cancer - no Rx recommended by ONC  5 Dementia 6 ^LFT's - prob mets vs obstruction 7 DNR  Plan - Daughters present explain that they want to focus on comfort only, no lab tests and no home medications.  Pt already DNR.  Have dc'd the antibiotics.    DVT Prophylaxis none  Code Status: DNR  Family Communication: at bedside  Disposition Plan: may expire in the hospital     [facility] Pager [phone]  Cell [phone]  If 7PM-7AM, please contact night-coverage [email or web address] Password [email or web address] 05/20/2015, 11:45 PM

## 2015-05-20 NOTE — Progress Notes (Signed)
Pharmacy Antibiotic Follow-up Note  Michelle Noble is a 82 y.o. year-old female admitted on 05/20/2015. Recent diagnosis of pancreatic Ca, referred to [facility] by PCP.  The patient is currently on day 1 of Vancomycin and Zosyn for r/o PNA.  Assessment/Plan: Awaiting renal function to calculate following abx doses/schedules Temp (24hrs), Avg:100.9 F (38.3 C), Min:100 F (37.8 C), Max:101.8 F (38.8 C)   Recent Labs Lab 05/20/15 1932  WBC 15.0*   No results for input(s): CREATININE in the last 168 hours. CrCl cannot be calculated (Patient has no serum creatinine result on file.).    No Known Allergies  Antimicrobials this admission: 1/30 Vancomycin  >>  1/30  Zosyn >>   Levels/dose changes this admission:  Microbiology results: 1/30 BCx: sent  Thank you for allowing pharmacy to be a part of this patient's care.  Minda Ditto PharmD 05/20/2015 8:34 PM

## 2015-05-21 DIAGNOSIS — Z66 Do not resuscitate: Secondary | ICD-10-CM | POA: Diagnosis present

## 2015-05-21 DIAGNOSIS — R509 Fever, unspecified: Secondary | ICD-10-CM | POA: Diagnosis not present

## 2015-05-21 DIAGNOSIS — R7989 Other specified abnormal findings of blood chemistry: Secondary | ICD-10-CM | POA: Diagnosis present

## 2015-05-21 DIAGNOSIS — R410 Disorientation, unspecified: Secondary | ICD-10-CM | POA: Diagnosis present

## 2015-05-21 DIAGNOSIS — F0390 Unspecified dementia without behavioral disturbance: Secondary | ICD-10-CM

## 2015-05-21 DIAGNOSIS — G301 Alzheimer's disease with late onset: Secondary | ICD-10-CM

## 2015-05-21 DIAGNOSIS — R413 Other amnesia: Secondary | ICD-10-CM | POA: Diagnosis present

## 2015-05-21 DIAGNOSIS — A419 Sepsis, unspecified organism: Secondary | ICD-10-CM | POA: Diagnosis not present

## 2015-05-21 DIAGNOSIS — R109 Unspecified abdominal pain: Secondary | ICD-10-CM | POA: Diagnosis present

## 2015-05-21 DIAGNOSIS — E872 Acidosis: Secondary | ICD-10-CM | POA: Diagnosis present

## 2015-05-21 DIAGNOSIS — C259 Malignant neoplasm of pancreas, unspecified: Secondary | ICD-10-CM

## 2015-05-21 DIAGNOSIS — F0281 Dementia in other diseases classified elsewhere with behavioral disturbance: Secondary | ICD-10-CM | POA: Diagnosis present

## 2015-05-21 DIAGNOSIS — Z79899 Other long term (current) drug therapy: Secondary | ICD-10-CM | POA: Diagnosis not present

## 2015-05-21 DIAGNOSIS — Z515 Encounter for palliative care: Secondary | ICD-10-CM | POA: Diagnosis present

## 2015-05-21 DIAGNOSIS — K219 Gastro-esophageal reflux disease without esophagitis: Secondary | ICD-10-CM | POA: Diagnosis present

## 2015-05-21 DIAGNOSIS — E785 Hyperlipidemia, unspecified: Secondary | ICD-10-CM | POA: Diagnosis present

## 2015-05-21 DIAGNOSIS — R627 Adult failure to thrive: Secondary | ICD-10-CM | POA: Diagnosis present

## 2015-05-21 DIAGNOSIS — J9811 Atelectasis: Secondary | ICD-10-CM | POA: Diagnosis present

## 2015-05-21 DIAGNOSIS — R1033 Periumbilical pain: Secondary | ICD-10-CM | POA: Diagnosis not present

## 2015-05-21 DIAGNOSIS — R7881 Bacteremia: Secondary | ICD-10-CM | POA: Insufficient documentation

## 2015-05-21 DIAGNOSIS — G9340 Encephalopathy, unspecified: Secondary | ICD-10-CM | POA: Diagnosis present

## 2015-05-21 DIAGNOSIS — R4182 Altered mental status, unspecified: Secondary | ICD-10-CM

## 2015-05-21 DIAGNOSIS — A4151 Sepsis due to Escherichia coli [E. coli]: Secondary | ICD-10-CM | POA: Diagnosis present

## 2015-05-21 DIAGNOSIS — F0391 Unspecified dementia with behavioral disturbance: Secondary | ICD-10-CM

## 2015-05-21 DIAGNOSIS — Z792 Long term (current) use of antibiotics: Secondary | ICD-10-CM | POA: Diagnosis not present

## 2015-05-21 DIAGNOSIS — I10 Essential (primary) hypertension: Secondary | ICD-10-CM | POA: Diagnosis present

## 2015-05-21 DIAGNOSIS — R945 Abnormal results of liver function studies: Secondary | ICD-10-CM | POA: Diagnosis present

## 2015-05-21 DIAGNOSIS — E559 Vitamin D deficiency, unspecified: Secondary | ICD-10-CM | POA: Diagnosis present

## 2015-05-21 DIAGNOSIS — Z79891 Long term (current) use of opiate analgesic: Secondary | ICD-10-CM | POA: Diagnosis not present

## 2015-05-21 DIAGNOSIS — C772 Secondary and unspecified malignant neoplasm of intra-abdominal lymph nodes: Secondary | ICD-10-CM | POA: Diagnosis present

## 2015-05-21 DIAGNOSIS — Z8261 Family history of arthritis: Secondary | ICD-10-CM | POA: Diagnosis not present

## 2015-05-21 DIAGNOSIS — K831 Obstruction of bile duct: Secondary | ICD-10-CM | POA: Diagnosis present

## 2015-05-21 MED ORDER — LORAZEPAM 2 MG/ML IJ SOLN: 0.5000 mg | Freq: Four times a day (QID) | INTRAMUSCULAR | Status: DC | PRN

## 2015-05-21 MED ORDER — ONDANSETRON HCL 4 MG PO TABS: 4.0000 mg | ORAL_TABLET | Freq: Four times a day (QID) | ORAL | Status: DC | PRN

## 2015-05-21 MED ORDER — DIPHENHYDRAMINE HCL 50 MG/ML IJ SOLN: 12.5000 mg | Freq: Once | INTRAMUSCULAR | Status: DC

## 2015-05-21 MED ORDER — MORPHINE SULFATE (PF) 2 MG/ML IV SOLN
2.0000 mg | INTRAVENOUS | Status: DC | PRN
  Administered 2015-05-21 – 2015-05-22 (×9): 4 mg via INTRAVENOUS
  Administered 2015-05-23: 2 mg via INTRAVENOUS
  Administered 2015-05-23 (×2): 4 mg via INTRAVENOUS
  Filled 2015-05-21 (×13): qty 2

## 2015-05-21 MED ORDER — ONDANSETRON HCL 4 MG/2ML IJ SOLN: 4.0000 mg | Freq: Four times a day (QID) | INTRAMUSCULAR | Status: DC | PRN

## 2015-05-21 MED ORDER — DIPHENHYDRAMINE HCL 12.5 MG/5ML PO ELIX
6.2500 mg | ORAL_SOLUTION | Freq: Once | ORAL | Status: AC
  Administered 2015-05-21: 6.25 mg via ORAL
  Filled 2015-05-21: qty 5

## 2015-05-21 MED ORDER — DIPHENHYDRAMINE HCL 50 MG/ML IJ SOLN
12.5000 mg | Freq: Once | INTRAMUSCULAR | Status: AC
  Administered 2015-05-21: 12.5 mg via INTRAMUSCULAR

## 2015-05-21 MED ORDER — LORAZEPAM 2 MG/ML IJ SOLN
0.5000 mg | Freq: Once | INTRAMUSCULAR | Status: AC
  Administered 2015-05-21: 0.5 mg via INTRAMUSCULAR
  Filled 2015-05-21: qty 1

## 2015-05-21 MED ORDER — DEXTROSE-NACL 5-0.45 % IV SOLN
INTRAVENOUS | Status: DC
  Administered 2015-05-21: 02:00:00 via INTRAVENOUS

## 2015-05-21 MED ORDER — FENTANYL 12 MCG/HR TD PT72
12.5000 ug | MEDICATED_PATCH | TRANSDERMAL | Status: DC
  Administered 2015-05-21: 12.5 ug via TRANSDERMAL
  Filled 2015-05-21: qty 1

## 2015-05-21 MED ORDER — ACETAMINOPHEN 650 MG RE SUPP: 650.0000 mg | Freq: Four times a day (QID) | RECTAL | Status: DC | PRN

## 2015-05-21 MED ORDER — POTASSIUM CHLORIDE IN NACL 20-0.45 MEQ/L-% IV SOLN
INTRAVENOUS | Status: DC
  Filled 2015-05-21: qty 1000

## 2015-05-21 MED ORDER — SODIUM CHLORIDE 0.9 % IV SOLN
250.0000 mg | Freq: Three times a day (TID) | INTRAVENOUS | Status: DC
  Administered 2015-05-21 – 2015-05-23 (×5): 250 mg via INTRAVENOUS
  Filled 2015-05-21 (×6): qty 250

## 2015-05-21 MED ORDER — DIPHENHYDRAMINE HCL 50 MG/ML IJ SOLN
12.5000 mg | Freq: Three times a day (TID) | INTRAMUSCULAR | Status: DC | PRN
  Administered 2015-05-22 – 2015-05-23 (×4): 12.5 mg via INTRAVENOUS
  Filled 2015-05-21 (×5): qty 1

## 2015-05-21 MED ORDER — ACETAMINOPHEN 325 MG PO TABS
650.0000 mg | ORAL_TABLET | Freq: Four times a day (QID) | ORAL | Status: DC | PRN
Start: 2015-05-21 — End: 2015-05-23

## 2015-05-21 NOTE — Progress Notes (Signed)
Nutrition Brief Note  Pt identified as at nutrition risk on the Malnutrition Screen Tool  Chart reviewed. Pt now transitioning to comfort care.  No further nutrition interventions warranted at this time.  Please consult as needed.   Chairty Toman, MS, RD, LDN Pager: [phone] After Hours Pager: [phone]  

## 2015-05-21 NOTE — Progress Notes (Deleted)
Patient has not voided since 1900 last night. Attempted bedpan 1 hour ago without success. Did bladder scan showing 787 ml in bladder. Patient with difficulty moving in bed due to pain. Order for in and out obtained. Will place foley and discuss with MD about it. Patient with increased pain moving and turning in bed.

## 2015-05-21 NOTE — Plan of Care (Signed)
Problem: Safety: Goal: Ability to remain free from injury will improve Outcome: Progressing Bed alarm set on bed. Daughters at bedside.

## 2015-05-21 NOTE — Progress Notes (Addendum)
PROGRESS NOTE  Michelle Noble [id number] DOB: 1919/12/19 DOA: 05/20/2015 PCP: Garnet Koyanagi, DO  HPI/Recap of past 24 hours:  Somnolent, open eyes briefly when being examined,two daughters in room  Assessment/Plan: Principal Problem:   Abdominal pain Active Problems:   Dementia   Alzheimer's disease   Pancreatic cancer metastasized to intra-abdominal lymph node (HCC)   LFTs abnormal   Altered mental status  g- bacteremia/sepsis presented on admission with leukocytosis/lactic acidosis/fever/sinus tachycardia with worsening mental status than baseline.  Patient has pancreatic cancer on home hospice, family expressed wish to try to treat bacteremia for one to two days to see if patient can improve, but their main goal is patient's comfort , very reasonable family. Explained to family g- bacteremia likely from pancreatic cancer, ua no infection. cxr favor atelectasis. On aspiration precaution.  Pain control: family reported it is getting hard to control patient's pain at home due to patient does not open her mouth to take meds. Agreed to try fentanyl patch. Low dose started on 1/31, titrate for effect  Palliative care consulted for further input in symptom control and transition to home hospice, likely d/c home with home hospice in 1-2 days  Over all poor prognosis due to advanced age, progressive dementia and pancreatic cancer. Family aware this episode could be terminal. They want to try 1-2 days to see if patient can get better, if not they will want full comfort measures and go home with home hospice.  Code Status: DNR  Family Communication: patient and two daughters in room  Disposition Plan:  To home hospice in 1-2 days   Consultants:  Palliative care  Procedures:  none  Antibiotics:  Vanc/zosyn in the ED on 1/30  Imipenem from 1/31   Objective: BP 173/98 mmHg  Pulse 106  Temp(Src) 100.5 F (38.1 C) (Rectal)  Resp 24  Ht 5\' 2"  (1.575 m)  Wt 61.462 kg  (135 lb 8 oz)  BMI 24.78 kg/m2  SpO2 95%  Intake/Output Summary (Last 24 hours) at 05/21/15 1304 Last data filed at 05/21/15 1039  Gross per 24 hour  Intake    363 ml  Output      0 ml  Net    363 ml   Filed Weights   05/20/15 2023  Weight: 61.462 kg (135 lb 8 oz)    Exam:   General:  Fail elderly female, very somnolent, only open eyes briefly when being examined.  Cardiovascular: RRR  Respiratory: CTABL  Abdomen: Soft/ND/NT, positive BS  Musculoskeletal: No Edema  Neuro: very somnolent, only open eyes briefly when being examined.  Data Reviewed: Basic Metabolic Panel:  Recent Labs Lab 05/20/15 2105  NA 141  K 5.4*  CL 106  CO2 24  GLUCOSE 149*  BUN 14  CREATININE 1.12*  CALCIUM 8.1*   Liver Function Tests:  Recent Labs Lab 05/20/15 2105  AST 378*  ALT 208*  ALKPHOS 506*  BILITOT 2.2*  PROT 7.3  ALBUMIN 2.9*   No results for input(s): LIPASE, AMYLASE in the last 168 hours. No results for input(s): AMMONIA in the last 168 hours. CBC:  Recent Labs Lab 05/20/15 1932  WBC 15.0*  NEUTROABS 12.1*  HGB 10.0*  HCT 30.5*  MCV 68.5*  PLT 353   Cardiac Enzymes:   No results for input(s): CKTOTAL, CKMB, CKMBINDEX, TROPONINI in the last 168 hours. BNP (last 3 results) No results for input(s): BNP in the last 8760 hours.  ProBNP (last 3 results) No results for input(s): PROBNP  in the last 8760 hours.  CBG: No results for input(s): GLUCAP in the last 168 hours.  Recent Results (from the past 240 hour(s))  Blood Culture (routine x 2)     Status: None (Preliminary result)   Collection Time: 05/20/15  8:00 PM  Result Value Ref Range Status   Specimen Description BLOOD LEFT ANTECUBITAL  Final   Special Requests BOTTLES DRAWN AEROBIC AND ANAEROBIC 5ML  Final   Culture  Setup Time   Final    GRAM NEGATIVE RODS AEROBIC BOTTLE ONLY CRITICAL RESULT CALLED TO, READ BACK BY AND VERIFIED WITH: Manya Silvas RN 10:55 05/21/15 ([email or web address])    Culture   Final      NO GROWTH < 24 HOURS Performed at [facility]    Report Status PENDING  Incomplete  Urine culture     Status: None (Preliminary result)   Collection Time: 05/20/15  8:42 PM  Result Value Ref Range Status   Specimen Description URINE, CATHETERIZED  Final   Special Requests NONE  Final   Culture   Final    NO GROWTH < 12 HOURS Performed at [facility]    Report Status PENDING  Incomplete  Blood Culture (routine x 2)     Status: None (Preliminary result)   Collection Time: 05/20/15  8:57 PM  Result Value Ref Range Status   Specimen Description BLOOD RIGHT HAND  Final   Special Requests BOTTLES DRAWN AEROBIC AND ANAEROBIC 5 ML  Final   Culture   Final    NO GROWTH < 24 HOURS Performed at [facility]    Report Status PENDING  Incomplete     Studies: Ct Chest W Contrast  05/21/2015  CLINICAL DATA:  Abnormal chest x-ray.  Sepsis. EXAM: CT CHEST WITH CONTRAST TECHNIQUE: Multidetector CT imaging of the chest was performed during intravenous contrast administration. CONTRAST:  75mL OMNIPAQUE IOHEXOL 300 MG/ML  SOLN COMPARISON:  None. FINDINGS: THORACIC INLET/BODY WALL: No acute abnormality. MEDIASTINUM: Cardiomegaly without pericardial effusion. Atherosclerosis. No acute vascular finding. No lymphadenopathy. Gas in tortuous esophagus correlates with the upper mediastinal lucency on previous chest x-ray. LUNG WINDOWS: No consolidation.  No effusion.  No suspicious pulmonary nodule. UPPER ABDOMEN: Intrahepatic bile duct dilatation is progressed since April 16, 2015. The hilum is not visualized. Patient has pancreatic cancer but the mass was previously seen at the pancreatic body level. OSSEOUS: No acute fracture.  No suspicious lytic or blastic lesions. IMPRESSION: 1. No intrathoracic finding to explain sepsis. 2. Progressive intrahepatic biliary dilatation/obstruction, partly visualized. Could there be cholangitis? Electronically Signed   By: Monte Fantasia M.D.   On:  05/21/2015 00:03   Dg Chest Port 1 View  05/20/2015  CLINICAL DATA:  82 year old female with sepsis and fever. Altered mental status. EXAM: PORTABLE CHEST 1 VIEW COMPARISON:  Frontal and lateral views 07/20/2013 FINDINGS: Lung volumes are low leading to crowding of bronchovascular structures. Questionable vascular congestion versus bronchovascular crowding. Mild cardiomegaly is unchanged. Minimal right infrahilar airspace opacity. There is a rounded 1.2 cm air density structure projecting over the mediastinum just superior to the left mainstem bronchus of uncertain significance. No large pleural effusion. No pneumothorax. Degenerative change noted of both shoulders. IMPRESSION: 1. Mild right infrahilar airspace opacity, appearance favors atelectasis. Superimposed mild pneumonia not excluded. 2. Air density structure projecting over the mediastinum superior to the left mainstem bronchus, significance uncertain. This could be further characterized with chest CT. 3. Mild cardiomegaly. Vascular congestion versus crowding secondary to low lung volumes.  Electronically Signed   By: Jeb Levering M.D.   On: 05/20/2015 19:52   US Abdomen Limited Ruq  05/21/2015  CLINICAL DATA:  Fever and elevated liver function tests EXAM: US ABDOMEN LIMITED - RIGHT UPPER QUADRANT COMPARISON:  Abdominal CT 04/16/2015 FINDINGS: Gallbladder: Sludge without calcified stone. The gallbladder is full but there is no wall thickening or focal tenderness to suggest acute cholecystitis. Bile ducts: Diffusely dilated/ obstructed without internal filling defect. Common bile duct diameter is 23 mm. The obstructive process is not seen. Patient has pancreatic cancer, but the mass is at the pancreatic body level. Liver: No focal lesion identified. Within normal limits in parenchymal echogenicity. Antegrade flow in the imaged portal venous system. IMPRESSION: 1. Progressive biliary dilatation/ obstruction since December 2016 without visualized  cause. 2. Gallbladder sludge.  No acute cholecystitis. Electronically Signed   By: Monte Fantasia M.D.   On: 05/21/2015 00:56    Scheduled Meds: . acetaminophen  650 mg Rectal Once  . fentaNYL  12.5 mcg Transdermal Q72H    Continuous Infusions: . dextrose 5 % and 0.45% NaCl 40 mL/hr at 05/21/15 0215     Time spent: 69mins from 1pm to 1:20 pm  Odean Mcelwain MD, PhD  [facility] Pager [phone]. If 7PM-7AM, please contact night-coverage at [email or web address], password [facility] 05/21/2015, 1:04 PM  LOS: 0 days

## 2015-05-21 NOTE — Progress Notes (Signed)
[facility] Room 1331-[facility] RN Visit  This is a related admission to [facility] diagnosis of Pancreatic Cancer. Patient is a DNR. Patient seen in room, with daughters at bedside.  Daughters report they called EMS, after patient woke up from a nap with lethargy and shaking. They verbalized there has been a progressive decline over the past couple weeks, with decreased oral intake and increased lethargy.  They also stated patient has had episodes of agitation, which she is pleasant at baseline.  Daughters are awaiting MD this morning, to discuss pain control and treatment options.  Michelle Noble stated she would be comfortable taking patient back home with hospice upon discharge.  We discussed hospice aides coming into the home to assist with baths.  Updated [facility] homecare RN, Chong Sicilian regarding this request.   Updated [facility] medication list and transfer summary placed on chart.  [facility] will continue to follow and anticipate any discharge needs.   Please call with any questions.  Thank you, Freddi Starr, RN, [facility] Liaison [phone]

## 2015-05-21 NOTE — Progress Notes (Signed)
Pharmacy Antibiotic Note  Michelle Noble is a 82 y.o. female admitted on 05/20/2015 with bacteremia.  Pharmacy has been consulted for Primaxin dosing.  Plan: The dose of Primaxin will be adjusted to 250mg  IV q8 based on renal function.  Height: 5\' 2"  (157.5 cm) Weight: 135 lb 8 oz (61.462 kg) IBW/kg (Calculated) : 50.1  Temp (24hrs), Avg:100.8 F (38.2 C), Min:100 F (37.8 C), Max:101.8 F (38.8 C)   Recent Labs Lab 05/20/15 1932 05/20/15 2025 05/20/15 2105  WBC 15.0*  --   --   CREATININE  --   --  1.12*  LATICACIDVEN  --  2.32*  --     Estimated Creatinine Clearance: 27.1 mL/min (by C-G formula based on Cr of 1.12).    No Known Allergies  Antimicrobials this admission: 1/30 >> Vanc >> 1/30 1/30 >> Zosyn >> 1/30 1/31 >> Primaxin >>  Dose adjustments this admission:   Microbiology results: 1/30 BCx: GNR 1/30 UCx: pending   Adrian Saran, PharmD, BCPS Pager [phone] 05/21/2015 1:06 PM

## 2015-05-21 NOTE — Progress Notes (Signed)
Report obtained from [facility] in [place]. Patient arrived to room 1331 from ED. Patient's daughters at bedside. Very restless trying to get oob. Assist with aid of NT to this RN to [facility]. Did void. Given activity blanket.

## 2015-05-21 NOTE — Progress Notes (Signed)
Home Care SW made visit to Patient with Home Care RN.  Patient was in bed, agitated trying to sit up.  RN's helped her to the side of the bed and she was using a busy blanket the hospital staff had provided.  Daughter Blanch Media stated the Benadryl she had last night helped calm her down.  She stated she was hoping over the next couple of days Patient's condition will improve.  She plans to take Patient home at time of discharge and SW will continue to follow when she comes home.  Support given to Blanch Media and her sister during visit.  Cecilio Asper, [employer]

## 2015-05-21 NOTE — Plan of Care (Signed)
Problem: Pain Managment: Goal: General experience of comfort will improve Outcome: Progressing Prn morphine given in ED for abdominal pain.

## 2015-05-21 NOTE — Care Management Note (Signed)
Case Management Note  Patient Details  Name: Michelle Noble MRN: [id number] Date of Birth: [birth year]  Subjective/Objective:     82 yo admitted with Abd pain. Hx of Pancreatic CA              Action/Plan: Pt from home with [facility] and will return with their services at DC. CM will continue to follow.  Expected Discharge Date:   (unknown)               Expected Discharge Plan:  Home w Hospice Care  In-House Referral:     Discharge planning Services  CM Consult  Post Acute Care Choice:    Choice offered to:     DME Arranged:    DME Agency:     HH Arranged:    HH Agency:     Status of Service:  In process, will continue to follow  Medicare Important Message Given:    Date Medicare IM Given:    Medicare IM give by:    Date Additional Medicare IM Given:    Additional Medicare Important Message give by:     If discussed at [place] of Stay Meetings, dates discussed:    Additional Comments:  Lynnell Catalan, RN 05/21/2015, 12:47 PM

## 2015-05-22 DIAGNOSIS — A4151 Sepsis due to Escherichia coli [E. coli]: Principal | ICD-10-CM

## 2015-05-22 LAB — URINE CULTURE: CULTURE: NO GROWTH

## 2015-05-22 LAB — COMPREHENSIVE METABOLIC PANEL
ALBUMIN: 3 g/dL — AB (ref 3.5–5.0)
ALT: 141 U/L — AB (ref 14–54)
AST: 140 U/L — AB (ref 15–41)
Alkaline Phosphatase: 360 U/L — ABNORMAL HIGH (ref 38–126)
Anion gap: 10 (ref 5–15)
BUN: 10 mg/dL (ref 6–20)
CHLORIDE: 109 mmol/L (ref 101–111)
CO2: 26 mmol/L (ref 22–32)
Calcium: 8.8 mg/dL — ABNORMAL LOW (ref 8.9–10.3)
Creatinine, Ser: 0.77 mg/dL (ref 0.44–1.00)
GFR calc Af Amer: >60 mL/min (ref >60)
GLUCOSE: 97 mg/dL (ref 65–99)
POTASSIUM: 3.8 mmol/L (ref 3.5–5.1)
SODIUM: 145 mmol/L (ref 135–145)
TOTAL PROTEIN: 7 g/dL (ref 6.5–8.1)
Total Bilirubin: 0.8 mg/dL (ref 0.3–1.2)

## 2015-05-22 LAB — CBC
HCT: 28.5 % — ABNORMAL LOW (ref 36.0–46.0)
Hemoglobin: 9.4 g/dL — ABNORMAL LOW (ref 12.0–15.0)
MCH: 22.7 pg — ABNORMAL LOW (ref 26.0–34.0)
MCHC: 33 g/dL (ref 30.0–36.0)
MCV: 68.7 fL — ABNORMAL LOW (ref 78.0–100.0)
PLATELETS: 325 10*3/uL (ref 150–400)
RBC: 4.15 MIL/uL (ref 3.87–5.11)
RDW: 14.7 % (ref 11.5–15.5)
WBC: 9.6 10*3/uL (ref 4.0–10.5)

## 2015-05-22 LAB — LACTIC ACID, PLASMA: LACTIC ACID, VENOUS: 1.5 mmol/L (ref 0.5–2.0)

## 2015-05-22 NOTE — Plan of Care (Signed)
Problem: Pain Managment: Goal: General experience of comfort will improve Outcome: Progressing Patient started on fentanyl patch on 1/31

## 2015-05-22 NOTE — Progress Notes (Signed)
Patient Demographics  Michelle Noble, is a 82 y.o. female, DOB - 14-Jul-1919, [id number]  Admit date - 05/20/2015   Admitting Physician Roney Jaffe, MD  Outpatient Primary MD for the patient is Garnet Koyanagi, DO  LOS - 1   Chief Complaint  Patient presents with  . Fever       Admission HPI/Brief narrative: 82 y.o. female with hx of HTN, HL, Alzhemier's dementia who was dx'd with pancreatic cancer in December 2016. She is on hospice care at home, presents with altered mental status, failure to thrive, workup was significant for gram negative rods bacteremia.  Subjective:   Michelle Noble today confused, can't provide any complaints, had an episodes of confusion/agitation improved with Benadryl .  Assessment & Plan    Principal Problem:   Abdominal pain Active Problems:   Dementia   Alzheimer's disease   Pancreatic cancer metastasized to intra-abdominal lymph node (HCC)   LFTs abnormal   Altered mental status   Sepsis (HCC)   Gram-negative bacteremia (HCC)  Sepsis secondary to Escherichia coli bacteremia - Patient presents with leukocytosis, elevated lactic acid, fever, tachycardia and worsening mental status - Blood culture growing Escherichia coli, sensitivities pending - Negative urinalysis, findings most likely related to her pancreatic tumor - Plan of care discussed with both daughters, and this point we'll continue with IV antibiotics, will transition to by mouth antibiotics in 1-2 days - Family do not wish for any aggressive measures  Pancreatic cancer - Patient is on hospice, no further workup, continue with pain management as needed  Acute encephalopathy - Patient with baseline dementia, seems to be having significant sundowning,  Elevated LFTs - Trending down, most likely related to sepsis versus pancreatic cancer  Over all poor prognosis due to advanced age,  progressive dementia and pancreatic cancer. Family aware this episode could be terminal. They want to try 1-2 days to see if patient can get better, if not they will want full comfort measures and go hospice facility.  Code Status: DNR  Family Communication: Spoke with daughters at bedside  Disposition Plan: Awaiting become place evaluation in a.m.   Procedures  none   Consults   none   Medications  Scheduled Meds: . acetaminophen  650 mg Rectal Once  . fentaNYL  12.5 mcg Transdermal Q72H  . imipenem-cilastatin  250 mg Intravenous 3 times per day   Continuous Infusions: . dextrose 5 % and 0.45% NaCl 50 mL/hr at 05/22/15 0224   PRN Meds:.acetaminophen **OR** acetaminophen, diphenhydrAMINE, LORazepam, morphine injection, ondansetron **OR** ondansetron (ZOFRAN) IV  DVT ProphylaxisSCDs   Lab Results  Component Value Date   PLT 325 05/22/2015    Antibiotics    Anti-infectives    Start     Dose/Rate Route Frequency Ordered Stop   05/21/15 2000  vancomycin (VANCOCIN) IVPB 750 mg/150 ml premix  Status:  Discontinued     750 mg 150 mL/hr over 60 Minutes Intravenous Every 24 hours 05/20/15 2241 05/21/15 0024   05/21/15 1400  imipenem-cilastatin (PRIMAXIN) 250 mg in sodium chloride 0.9 % 100 mL IVPB     250 mg 200 mL/hr over 30 Minutes Intravenous 3 times per day 05/21/15 1309     05/21/15 0400  piperacillin-tazobactam (ZOSYN) IVPB 3.375 g  Status:  Discontinued     3.375 g 12.5 mL/hr over 240 Minutes Intravenous 3 times per day 05/20/15 2240 05/21/15 0023   05/20/15 1915  piperacillin-tazobactam (ZOSYN) IVPB 3.375 g     3.375 g 100 mL/hr over 30 Minutes Intravenous  Once 05/20/15 1914 05/20/15 2022   05/20/15 1915  vancomycin (VANCOCIN) IVPB 1000 mg/200 mL premix     1,000 mg 200 mL/hr over 60 Minutes Intravenous  Once 05/20/15 1914 05/20/15 2107          Objective:   Filed Vitals:   05/21/15 1940 05/21/15 2210 05/22/15 0449 05/22/15 0455  BP:  116/96 80/56  118/78  Pulse: 88 109 79   Temp:  99.5 F (37.5 C) 97.7 F (36.5 C)   TempSrc:  Oral Axillary   Resp:  22 20   Height:      Weight:      SpO2:  100%      Wt Readings from Last 3 Encounters:  05/20/15 61.462 kg (135 lb 8 oz)  05/02/15 61.462 kg (135 lb 8 oz)  04/04/15 61.689 kg (136 lb)     Intake/Output Summary (Last 24 hours) at 05/22/15 1411 Last data filed at 05/22/15 0456  Gross per 24 hour  Intake 466.67 ml  Output      0 ml  Net 466.67 ml     Physical Exam   General: Fail elderly female, very somnolent, only open eyes briefly when being examined.  Cardiovascular: RRR  Respiratory: CTABL  Abdomen: Soft/ND/NT, positive BS  Musculoskeletal: No Edema  Neuro: very somnolent, only open eyes briefly when being examined.   Data Review   Micro Results Recent Results (from the past 240 hour(s))  Blood Culture (routine x 2)     Status: None (Preliminary result)   Collection Time: 05/20/15  8:00 PM  Result Value Ref Range Status   Specimen Description BLOOD LEFT ANTECUBITAL  Final   Special Requests BOTTLES DRAWN AEROBIC AND ANAEROBIC 5ML  Final   Culture  Setup Time   Final    GRAM NEGATIVE RODS AEROBIC BOTTLE ONLY CRITICAL RESULT CALLED TO, READ BACK BY AND VERIFIED WITH: Manya Silvas RN 10:55 05/21/15 ([email or web address])    Culture   Final    ESCHERICHIA COLI Performed at [facility]    Report Status PENDING  Incomplete  Urine culture     Status: None   Collection Time: 05/20/15  8:42 PM  Result Value Ref Range Status   Specimen Description URINE, CATHETERIZED  Final   Special Requests NONE  Final   Culture   Final    NO GROWTH 2 DAYS Performed at [facility]    Report Status 05/22/2015 FINAL  Final  Blood Culture (routine x 2)     Status: None (Preliminary result)   Collection Time: 05/20/15  8:57 PM  Result Value Ref Range Status   Specimen Description BLOOD RIGHT HAND  Final   Special Requests BOTTLES DRAWN AEROBIC AND ANAEROBIC 5 ML   Final   Culture   Final    NO GROWTH 2 DAYS Performed at [facility]    Report Status PENDING  Incomplete    Radiology Reports Ct Chest W Contrast  05/21/2015  CLINICAL DATA:  Abnormal chest x-ray.  Sepsis. EXAM: CT CHEST WITH CONTRAST TECHNIQUE: Multidetector CT imaging of the chest was performed during intravenous contrast administration. CONTRAST:  26mL OMNIPAQUE IOHEXOL 300 MG/ML  SOLN COMPARISON:  None. FINDINGS: THORACIC INLET/BODY WALL: No acute abnormality. MEDIASTINUM: Cardiomegaly  without pericardial effusion. Atherosclerosis. No acute vascular finding. No lymphadenopathy. Gas in tortuous esophagus correlates with the upper mediastinal lucency on previous chest x-ray. LUNG WINDOWS: No consolidation.  No effusion.  No suspicious pulmonary nodule. UPPER ABDOMEN: Intrahepatic bile duct dilatation is progressed since April 16, 2015. The hilum is not visualized. Patient has pancreatic cancer but the mass was previously seen at the pancreatic body level. OSSEOUS: No acute fracture.  No suspicious lytic or blastic lesions. IMPRESSION: 1. No intrathoracic finding to explain sepsis. 2. Progressive intrahepatic biliary dilatation/obstruction, partly visualized. Could there be cholangitis? Electronically Signed   By: Monte Fantasia M.D.   On: 05/21/2015 00:03   Dg Chest Port 1 View  05/20/2015  CLINICAL DATA:  82 year old female with sepsis and fever. Altered mental status. EXAM: PORTABLE CHEST 1 VIEW COMPARISON:  Frontal and lateral views 07/20/2013 FINDINGS: Lung volumes are low leading to crowding of bronchovascular structures. Questionable vascular congestion versus bronchovascular crowding. Mild cardiomegaly is unchanged. Minimal right infrahilar airspace opacity. There is a rounded 1.2 cm air density structure projecting over the mediastinum just superior to the left mainstem bronchus of uncertain significance. No large pleural effusion. No pneumothorax. Degenerative change noted of  both shoulders. IMPRESSION: 1. Mild right infrahilar airspace opacity, appearance favors atelectasis. Superimposed mild pneumonia not excluded. 2. Air density structure projecting over the mediastinum superior to the left mainstem bronchus, significance uncertain. This could be further characterized with chest CT. 3. Mild cardiomegaly. Vascular congestion versus crowding secondary to low lung volumes. Electronically Signed   By: Jeb Levering M.D.   On: 05/20/2015 19:52   US Abdomen Limited Ruq  05/21/2015  CLINICAL DATA:  Fever and elevated liver function tests EXAM: US ABDOMEN LIMITED - RIGHT UPPER QUADRANT COMPARISON:  Abdominal CT 04/16/2015 FINDINGS: Gallbladder: Sludge without calcified stone. The gallbladder is full but there is no wall thickening or focal tenderness to suggest acute cholecystitis. Bile ducts: Diffusely dilated/ obstructed without internal filling defect. Common bile duct diameter is 23 mm. The obstructive process is not seen. Patient has pancreatic cancer, but the mass is at the pancreatic body level. Liver: No focal lesion identified. Within normal limits in parenchymal echogenicity. Antegrade flow in the imaged portal venous system. IMPRESSION: 1. Progressive biliary dilatation/ obstruction since December 2016 without visualized cause. 2. Gallbladder sludge.  No acute cholecystitis. Electronically Signed   By: Monte Fantasia M.D.   On: 05/21/2015 00:56     CBC  Recent Labs Lab 05/20/15 1932 05/22/15 0432  WBC 15.0* 9.6  HGB 10.0* 9.4*  HCT 30.5* 28.5*  PLT 353 325  MCV 68.5* 68.7*  MCH 22.5* 22.7*  MCHC 32.8 33.0  RDW 15.2 14.7  LYMPHSABS 1.5  --   MONOABS 1.4*  --   EOSABS 0.0  --   BASOSABS 0.0  --     Chemistries   Recent Labs Lab 05/20/15 2105 05/22/15 0432  NA 141 145  K 5.4* 3.8  CL 106 109  CO2 24 26  GLUCOSE 149* 97  BUN 14 10  CREATININE 1.12* 0.77  CALCIUM 8.1* 8.8*  AST 378* 140*  ALT 208* 141*  ALKPHOS 506* 360*  BILITOT 2.2* 0.8     ------------------------------------------------------------------------------------------------------------------ estimated creatinine clearance is 37.9 mL/min (by C-G formula based on Cr of 0.77). ------------------------------------------------------------------------------------------------------------------ No results for input(s): HGBA1C in the last 72 hours. ------------------------------------------------------------------------------------------------------------------ No results for input(s): CHOL, HDL, LDLCALC, TRIG, CHOLHDL, LDLDIRECT in the last 72 hours. ------------------------------------------------------------------------------------------------------------------ No results for input(s): TSH, T4TOTAL, T3FREE, THYROIDAB in  the last 72 hours.  Invalid input(s): FREET3 ------------------------------------------------------------------------------------------------------------------ No results for input(s): VITAMINB12, FOLATE, FERRITIN, TIBC, IRON, RETICCTPCT in the last 72 hours.  Coagulation profile No results for input(s): INR, PROTIME in the last 168 hours.  No results for input(s): DDIMER in the last 72 hours.  Cardiac Enzymes No results for input(s): CKMB, TROPONINI, MYOGLOBIN in the last 168 hours.  Invalid input(s): CK ------------------------------------------------------------------------------------------------------------------ Invalid input(s): POCBNP     Time Spent in minutes   25 minutes   Michelle Noble M.D on 05/22/2015 at 2:11 PM  Between 7am to 7pm - Pager - [phone]  After 7pm go to [email or web address] - password [email or web address]  [facility]   Office  [phone]

## 2015-05-22 NOTE — Progress Notes (Signed)
[facility] Room 1331-[facility] RN Visit  This is a related admission to [facility] diagnosis of Pancreatic Cancer. Patient is a DNR. Patient seen in room, with daughters at bedside. Patient is resting comfortably in the chair. Daughters report they would like their mother to be transferred to [facility].  They stated they understand their mother's condition has declined and she may not have much more time.  They also feel they are no longer able to care for her in the home. They understand transfer to [facility] would be contingent upon bed availability and medical approval.  Updated [facility] social worker, [place] who will follow up on potential for bed at [facility]. Patient is on RA with O2 sats and respirations WNL. Per chart review, patient has received 4 doses of 4mg  Morphine IV in the past 24 hours.  [facility] will continue to follow and anticipate any discharge needs.  Please call with any questions.  Thank you, Freddi Starr, RN, [facility] Liaison [phone]

## 2015-05-23 LAB — CULTURE, BLOOD (ROUTINE X 2)

## 2015-05-23 MED ORDER — FENTANYL 12 MCG/HR TD PT72: 12.5000 ug | MEDICATED_PATCH | TRANSDERMAL | Status: AC

## 2015-05-23 MED ORDER — ACETAMINOPHEN 650 MG RE SUPP: 650.0000 mg | Freq: Once | RECTAL | Status: AC

## 2015-05-23 MED ORDER — LORAZEPAM 2 MG/ML IJ SOLN: 0.5000 mg | Freq: Four times a day (QID) | INTRAMUSCULAR | Status: DC | PRN

## 2015-05-23 MED ORDER — MORPHINE SULFATE 20 MG/5ML PO SOLN: 2.5000 mg | ORAL | Status: AC | PRN

## 2015-05-23 MED ORDER — MORPHINE SULFATE (PF) 2 MG/ML IV SOLN: 2.0000 mg | INTRAVENOUS | Status: DC | PRN

## 2015-05-23 MED ORDER — LORAZEPAM 2 MG/ML PO CONC: 1.0000 mg | ORAL | Status: AC | PRN

## 2015-05-23 NOTE — Progress Notes (Signed)
D/c instructions given to daughter,verbalized understanding. IV discontinued per MD.

## 2015-05-23 NOTE — Progress Notes (Signed)
Patient d/c'd to [facility] via non emergency ambulance. Patient is awake, comfortable, stable.

## 2015-05-23 NOTE — Progress Notes (Signed)
CSW received notification from [facility] that pt has a bed available at [facility] today.  CSW facilitated pt discharge needs including contacting facility, faxing pt discharge information, discussing with pt family at bedside, providing RN phone number to call report, and arranging ambulance transport for pt to [facility].  Pt family grateful for the care of MD and nursing care.   No further social work needs identified at this time.  CSW signing off.   Alison Murray, MSW, [place] [phone]

## 2015-05-23 NOTE — Discharge Instructions (Signed)
-   Management as per [facility] place.

## 2015-05-23 NOTE — Progress Notes (Signed)
Report given to Silvio Clayman at [facility]. Awaiting for ambulance to transport.

## 2015-05-23 NOTE — Discharge Summary (Addendum)
Michelle Noble, is a 82 y.o. female  DOB 10-09-1919  MRN [id number].  Admission date:  05/20/2015  Admitting Physician  Roney Jaffe, MD  Discharge Date:  05/23/2015   Primary MD  Garnet Koyanagi, DO  Recommendations for primary care physician for things to follow:  - Management as per [facility] place.   Admission Diagnosis  Abnormal chest x-ray [R93.8] Fever [R50.9] LFT elevation [R79.89] Sepsis, due to unspecified organism [facility]) [A41.9]   Discharge Diagnosis  Abnormal chest x-ray [R93.8] Fever [R50.9] LFT elevation [R79.89] Sepsis, due to unspecified organism [facility]) [A41.9]    Principal Problem:   Abdominal pain Active Problems:   Dementia   Alzheimer's disease   Pancreatic cancer metastasized to intra-abdominal lymph node (HCC)   LFTs abnormal   Altered mental status   Sepsis ([place])   Gram-negative bacteremia [facility])      Past Medical History  Diagnosis Date  . Frequent headaches   . High blood pressure   . Hyperlipidemia   . Vitamin D deficiency   . Hypokalemia   . GERD (gastroesophageal reflux disease)   . Memory loss   . Late onset Alzheimer's disease with behavioral disturbance 03/26/2015  . Pancreatic cancer metastasized to intra-abdominal lymph node ([place]) 05/02/2015    Past Surgical History  Procedure Laterality Date  . Appendectomy         History of present illness and  Hospital Course:     Kindly see H&P for history of present illness and admission details, please review complete Labs, Consult reports and Test reports for all details in brief  HPI  from the history and physical done on the day of admission 05/20/2015  Michelle Noble is a 82 y.o. female with hx of HTN, HL, Alzhemier's dementia who was dx'd with pancreatic cancer in December 2016. She is on hospice care at home. For the last 10 days or so patient has not been eating or taking her medications.  Last 24 hours has been acting strangely, arguing some. She was difficult to wake from a nap. Less able to ambulate. She was brought to ED where temp was 101.8, BP 120/68, HR 88, R 18-22 and WBC 15K. Also AST 378 and ALT 208 w Tbili 2.2. Alkphos 506. Creat 1.12. CO2 24. Na 141 K 5.4.   Patient has hx of dementia and has been deteriorating per the notes in EPIC. In Dec developed abd pain and abd US showed complex pancreatic mass with dilated hepatic ducts in the liver. She was seen by Oncology mid January and determined that she would go to hospice care, no other treatments planned. Pt was referred to hospice at that time. Now patient is living with her daughter, on hospice at home.   The patient provides no history. The two daughters here say that they have stopped all of her home medications except that which is needed for comfort. They are requesting comfort care only for their mother and do not want a lot of tests/ investigations ordered. Pain medication is  all they are focused on as the patient is having severe abd pain according to the family members present.   Where does patient live with her dtr Can patient participate in ADLs? Yes prior to illness  Hospital Course  82 y.o. female with hx of HTN, HL, Alzhemier's dementia who was dx'd with pancreatic cancer in December 2016. She is on hospice care at home, presents with altered mental status, failure to thrive, workup was significant for Escherichia coli bacteremia, patient was on Primaxin during hospital stay, patient transition to comfort care, being discharged to [facility] place.  Sepsis secondary to Escherichia coli bacteremia - Patient presents with leukocytosis, elevated lactic acid, fever, tachycardia and worsening mental status - Blood culture growing Escherichia coli, treated with IV) during hospital stay, stop on discharge, please see discussion below. - Negative urinalysis, findings most likely related to her  pancreatic tumor - Plan was discussed with her daughters, at this point patient with poor life quality, known progressive dementia, which for comfort measures only, no further treatment as an outpatient.  Pancreatic cancer - Patient is on hospice, no further workup, continue with pain management as needed  Acute encephalopathy - Patient with baseline dementia, seems to be having significant sundowning,  Elevated LFTs - Trending down, most likely related to sepsis versus pancreatic cancer  Patient with overall very poor prognosis, due to her advanced age, progressive dementia, and pancreatic cancer, was on hospice at home, initially tried medical management with IV antibiotics, no improvement of medical status, at this point family wish to proceed with full comfort measures, and patient is being discharged to [facility] hospice place.  Discharge Condition: Less than 2 weeks      Discharge Instructions  and  Discharge Medications         Discharge Instructions    Discharge instructions    Complete by:  As directed   - Management as per [facility] place.     Increase activity slowly    Complete by:  As directed             Medication List    STOP taking these medications        atorvastatin 20 MG tablet  Commonly known as:  LIPITOR     ciprofloxacin 250 MG tablet  Commonly known as:  CIPRO     furosemide 20 MG tablet  Commonly known as:  LASIX     memantine 10 MG tablet  Commonly known as:  NAMENDA     metoprolol succinate 50 MG 24 hr tablet  Commonly known as:  TOPROL-XL     mirtazapine 15 MG tablet  Commonly known as:  REMERON     morphine 15 MG 12 hr tablet  Commonly known as:  MS CONTIN  Replaced by:  morphine 20 MG/5ML solution     omeprazole 20 MG capsule  Commonly known as:  PRILOSEC     oxyCODONE-acetaminophen 5-325 MG tablet  Commonly known as:  ROXICET     potassium chloride 10 MEQ tablet  Commonly known as:  K-DUR,KLOR-CON     valsartan 80  MG tablet  Commonly known as:  DIOVAN      TAKE these medications        acetaminophen 650 MG suppository  Commonly known as:  TYLENOL  Place 1 suppository (650 mg total) rectally once.     fentaNYL 12 MCG/HR  Commonly known as:  DURAGESIC - dosed mcg/hr  Place 1 patch (12.5 mcg total) onto the skin  every 3 (three) days.     LORazepam 2 MG/ML concentrated solution  Commonly known as:  ATIVAN  Take 0.5 mLs (1 mg total) by mouth every 4 (four) hours as needed for anxiety.     morphine 20 MG/5ML solution  Take 0.6 mLs (2.4 mg total) by mouth every 2 (two) hours as needed for pain.          Diet and Activity recommendation: See Discharge Instructions above   Consults obtained -  None   Major procedures and Radiology Reports - PLEASE review detailed and final reports for all details, in brief -      Ct Chest W Contrast  05/21/2015  CLINICAL DATA:  Abnormal chest x-ray.  Sepsis. EXAM: CT CHEST WITH CONTRAST TECHNIQUE: Multidetector CT imaging of the chest was performed during intravenous contrast administration. CONTRAST:  18mL OMNIPAQUE IOHEXOL 300 MG/ML  SOLN COMPARISON:  None. FINDINGS: THORACIC INLET/BODY WALL: No acute abnormality. MEDIASTINUM: Cardiomegaly without pericardial effusion. Atherosclerosis. No acute vascular finding. No lymphadenopathy. Gas in tortuous esophagus correlates with the upper mediastinal lucency on previous chest x-ray. LUNG WINDOWS: No consolidation.  No effusion.  No suspicious pulmonary nodule. UPPER ABDOMEN: Intrahepatic bile duct dilatation is progressed since April 16, 2015. The hilum is not visualized. Patient has pancreatic cancer but the mass was previously seen at the pancreatic body level. OSSEOUS: No acute fracture.  No suspicious lytic or blastic lesions. IMPRESSION: 1. No intrathoracic finding to explain sepsis. 2. Progressive intrahepatic biliary dilatation/obstruction, partly visualized. Could there be cholangitis? Electronically  Signed   By: Monte Fantasia M.D.   On: 05/21/2015 00:03   Dg Chest Port 1 View  05/20/2015  CLINICAL DATA:  82 year old female with sepsis and fever. Altered mental status. EXAM: PORTABLE CHEST 1 VIEW COMPARISON:  Frontal and lateral views 07/20/2013 FINDINGS: Lung volumes are low leading to crowding of bronchovascular structures. Questionable vascular congestion versus bronchovascular crowding. Mild cardiomegaly is unchanged. Minimal right infrahilar airspace opacity. There is a rounded 1.2 cm air density structure projecting over the mediastinum just superior to the left mainstem bronchus of uncertain significance. No large pleural effusion. No pneumothorax. Degenerative change noted of both shoulders. IMPRESSION: 1. Mild right infrahilar airspace opacity, appearance favors atelectasis. Superimposed mild pneumonia not excluded. 2. Air density structure projecting over the mediastinum superior to the left mainstem bronchus, significance uncertain. This could be further characterized with chest CT. 3. Mild cardiomegaly. Vascular congestion versus crowding secondary to low lung volumes. Electronically Signed   By: Jeb Levering M.D.   On: 05/20/2015 19:52   US Abdomen Limited Ruq  05/21/2015  CLINICAL DATA:  Fever and elevated liver function tests EXAM: US ABDOMEN LIMITED - RIGHT UPPER QUADRANT COMPARISON:  Abdominal CT 04/16/2015 FINDINGS: Gallbladder: Sludge without calcified stone. The gallbladder is full but there is no wall thickening or focal tenderness to suggest acute cholecystitis. Bile ducts: Diffusely dilated/ obstructed without internal filling defect. Common bile duct diameter is 23 mm. The obstructive process is not seen. Patient has pancreatic cancer, but the mass is at the pancreatic body level. Liver: No focal lesion identified. Within normal limits in parenchymal echogenicity. Antegrade flow in the imaged portal venous system. IMPRESSION: 1. Progressive biliary dilatation/ obstruction  since December 2016 without visualized cause. 2. Gallbladder sludge.  No acute cholecystitis. Electronically Signed   By: Monte Fantasia M.D.   On: 05/21/2015 00:56    Micro Results     Recent Results (from the past 240 hour(s))  Blood Culture (routine x  2)     Status: None   Collection Time: 05/20/15  8:00 PM  Result Value Ref Range Status   Specimen Description BLOOD LEFT ANTECUBITAL  Final   Special Requests BOTTLES DRAWN AEROBIC AND ANAEROBIC 5ML  Final   Culture  Setup Time   Final    GRAM NEGATIVE RODS AEROBIC BOTTLE ONLY CRITICAL RESULT CALLED TO, READ BACK BY AND VERIFIED WITH: Manya Silvas RN 10:55 05/21/15 ([email or web address])    Culture   Final    ESCHERICHIA COLI Performed at [facility]    Report Status 05/23/2015 FINAL  Final   Organism ID, Bacteria ESCHERICHIA COLI  Final      Susceptibility   Escherichia coli - MIC*    AMPICILLIN 16 INTERMEDIATE Intermediate     CEFAZOLIN <=4 SENSITIVE Sensitive     CEFEPIME <=1 SENSITIVE Sensitive     CEFTAZIDIME <=1 SENSITIVE Sensitive     CEFTRIAXONE <=1 SENSITIVE Sensitive     CIPROFLOXACIN >=4 RESISTANT Resistant     GENTAMICIN <=1 SENSITIVE Sensitive     IMIPENEM <=0.25 SENSITIVE Sensitive     TRIMETH/SULFA <=20 SENSITIVE Sensitive     AMPICILLIN/SULBACTAM 4 INTERMEDIATE Intermediate     PIP/TAZO <=4 SENSITIVE Sensitive     * ESCHERICHIA COLI  Urine culture     Status: None   Collection Time: 05/20/15  8:42 PM  Result Value Ref Range Status   Specimen Description URINE, CATHETERIZED  Final   Special Requests NONE  Final   Culture   Final    NO GROWTH 2 DAYS Performed at [facility]    Report Status 05/22/2015 FINAL  Final  Blood Culture (routine x 2)     Status: None (Preliminary result)   Collection Time: 05/20/15  8:57 PM  Result Value Ref Range Status   Specimen Description BLOOD RIGHT HAND  Final   Special Requests BOTTLES DRAWN AEROBIC AND ANAEROBIC 5 ML  Final   Culture   Final    NO GROWTH 2  DAYS Performed at [facility]    Report Status PENDING  Incomplete       Today   Subjective:   Michelle Noble today lethargic, obtunded, cannot provide any complaints.  Objective:   Blood pressure 150/102, pulse 100, temperature 97.6 F (36.4 C), temperature source Axillary, resp. rate 22, height 5\' 2"  (1.575 m), weight 61.462 kg (135 lb 8 oz), SpO2 100 %.   Intake/Output Summary (Last 24 hours) at 05/23/15 1121 Last data filed at 05/22/15 2100  Gross per 24 hour  Intake    240 ml  Output    200 ml  Net     40 ml    Exam  General: Fail elderly female, very somnolent  Cardiovascular: RRR  Respiratory: CTABL  Abdomen: Soft/ND/NT, positive BS  Musculoskeletal: No Edema  Data Review   CBC w Diff:  Lab Results  Component Value Date   WBC 9.6 05/22/2015   HGB 9.4* 05/22/2015   HCT 28.5* 05/22/2015   PLT 325 05/22/2015   LYMPHOPCT 10 05/20/2015   MONOPCT 9 05/20/2015   EOSPCT 0 05/20/2015   BASOPCT 0 05/20/2015    CMP:  Lab Results  Component Value Date   NA 145 05/22/2015   K 3.8 05/22/2015   CL 109 05/22/2015   CO2 26 05/22/2015   BUN 10 05/22/2015   CREATININE 0.77 05/22/2015   CREATININE 0.95 05/04/2014   PROT 7.0 05/22/2015   ALBUMIN 3.0* 05/22/2015   BILITOT 0.8 05/22/2015  ALKPHOS 360* 05/22/2015   AST 140* 05/22/2015   ALT 141* 05/22/2015  .   Total Time in preparing paper work, data evaluation and todays exam - less than 30 minutes  Sun Wilensky M.D on 05/23/2015 at 11:21 AM  [facility]   Office  [phone]

## 2015-05-25 LAB — CULTURE, BLOOD (ROUTINE X 2): Culture: NO GROWTH

## 2015-05-29 ENCOUNTER — Encounter: Payer: Self-pay | Admitting: Neurology

## 2015-06-04 ENCOUNTER — Telehealth: Payer: Self-pay | Admitting: *Deleted

## 2015-06-04 NOTE — Telephone Encounter (Signed)
Received notice of [place] Death; forwarded to provider/SLS 02/14

## 2015-06-17 NOTE — Telephone Encounter (Signed)
i never received a death certificate

## 2015-06-17 NOTE — Telephone Encounter (Signed)
Do you remember signing this death certificate?

## 2015-06-17 NOTE — Telephone Encounter (Signed)
Crystal with [employer] called to check status on death certificate. It was overnighted to [place] 2/21 and we received on 2/22. She is wanting to know if we have completed and returned via mail or to the health department. Please call her at [phone].

## 2015-06-18 NOTE — Telephone Encounter (Signed)
I called Michelle Noble to make her aware the Death certificate has been found.  The receptionist will give her the information and have her call me back.   KP

## 2015-06-18 NOTE — Telephone Encounter (Deleted)
Ewing Schlein, CMA at 06/18/2015 9:23 AM     Status: Addendum       Expand All Collapse All   I spoke with Crystal and I apologized for the delay, I made her aware that the death certificate was placed in the wrong mailbox. She said it was more important to get it tot he health department because if not they would be in trouble. I made her aware I would Fax a copy to at [place] at [phone], and I spoke with Margaretmary Lombard at the [facility] [phone], she will come by the office to pick up the original. KP

## 2015-06-18 NOTE — Telephone Encounter (Addendum)
I spoke with Michelle Noble and I apologized for the delay, I made her aware that the death certificate was placed in the wrong mailbox. She said it was more important to get it tot he health department because if not they would be in trouble. I made her aware I would Fax a copy to at [place] at [phone], and I spoke with Margaretmary Lombard at the [facility] [phone], she will come by the office to pick up the original.     KP

## 2015-06-18 NOTE — Telephone Encounter (Signed)
Michelle Noble returned your call. PLease call her back.

## 2015-06-19 DEATH — deceased

## 2015-12-25 ENCOUNTER — Ambulatory Visit: Payer: Medicare Other | Admitting: Neurology

## 2016-08-17 IMAGING — DX DG CHEST 1V PORT
1 series · 1 of 1 positions shown · non-contrast
Comparison: Frontal and lateral views 07/20/2013

CLINICAL DATA: [AGE] female with sepsis and fever. Altered
mental status.

EXAM:
PORTABLE CHEST 1 VIEW

[chest ap]
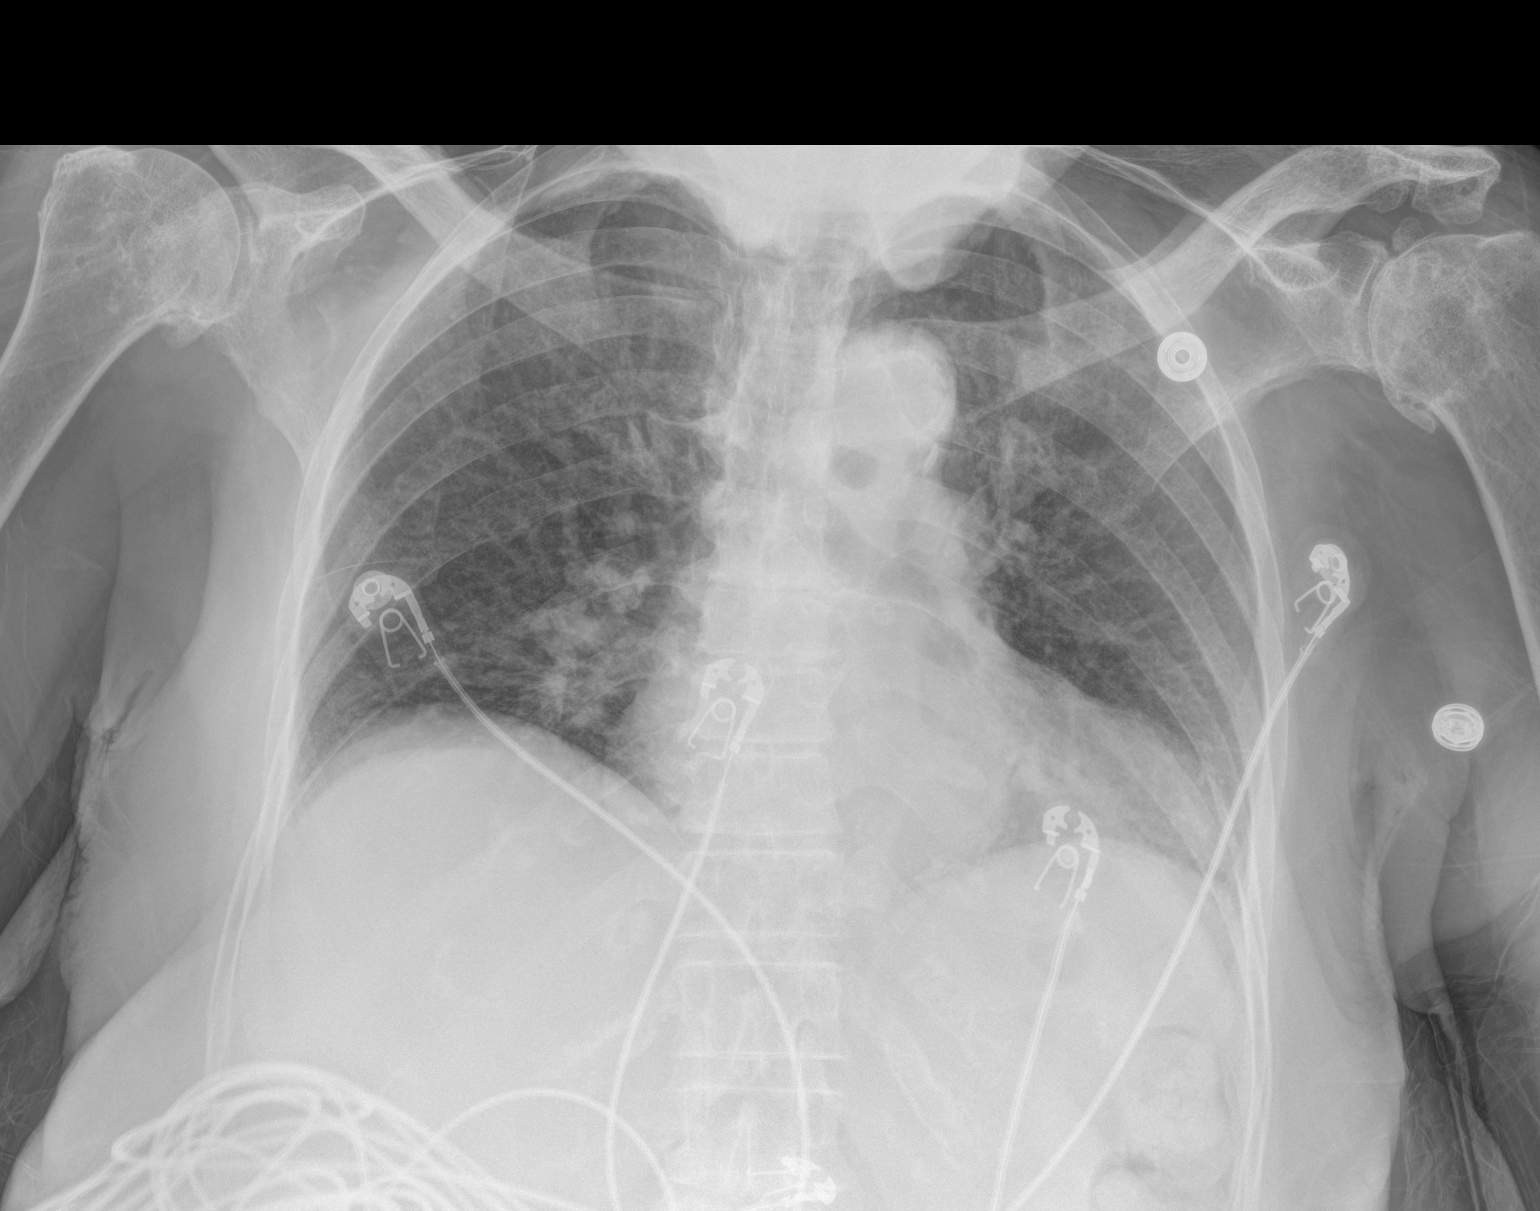

[1 of 1 positions shown; findings below may reference images not displayed]

FINDINGS: Lung volumes are low leading to crowding of bronchovascular
structures. Questionable vascular congestion versus bronchovascular
crowding. Mild cardiomegaly is unchanged. Minimal right infrahilar
airspace opacity. There is a rounded 1.2 cm air density structure
projecting over the mediastinum just superior to the left mainstem
bronchus of uncertain significance. No large pleural effusion. No
pneumothorax. Degenerative change noted of both shoulders.
IMPRESSION: 1. Mild right infrahilar airspace opacity, appearance favors
atelectasis. Superimposed mild pneumonia not excluded.
2. Air density structure projecting over the mediastinum superior to
the left mainstem bronchus, significance uncertain. This could be
further characterized with chest CT.
3. Mild cardiomegaly. Vascular congestion versus crowding secondary
to low lung volumes.

## 2016-08-17 IMAGING — CT CT CHEST W/ CM
2 of 3 series · 15 of 36 positions shown, 18 images · IV contrast (omnipaque)
Comparison: None.

CLINICAL DATA: Abnormal chest x-ray.  Sepsis.

EXAM:
CT CHEST WITH CONTRAST
TECHNIQUE: Multidetector CT imaging of the chest was performed during
intravenous contrast administration.
CONTRAST:  75mL OMNIPAQUE IOHEXOL 300 MG/ML  SOLN

[Series 2: chest with st · axial · 0.56mm/px · z∈[+1360,+1550]mm · 12 of 46 slices shown, 15 images]
[im 4/46  mediastinal]
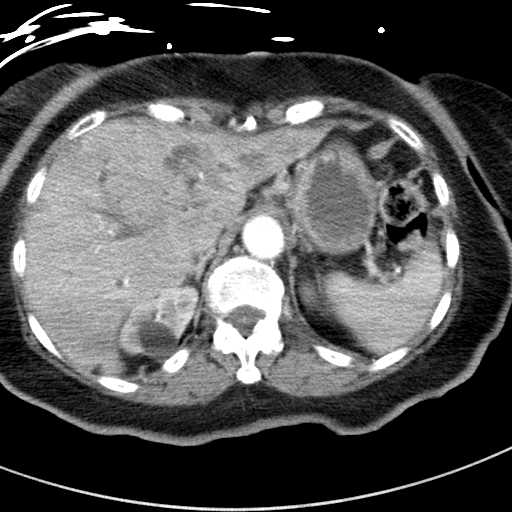
[im 4/46  lung]
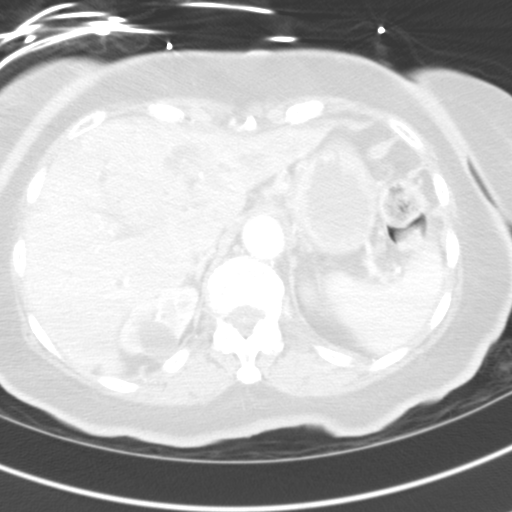
[im 7/46  lung]
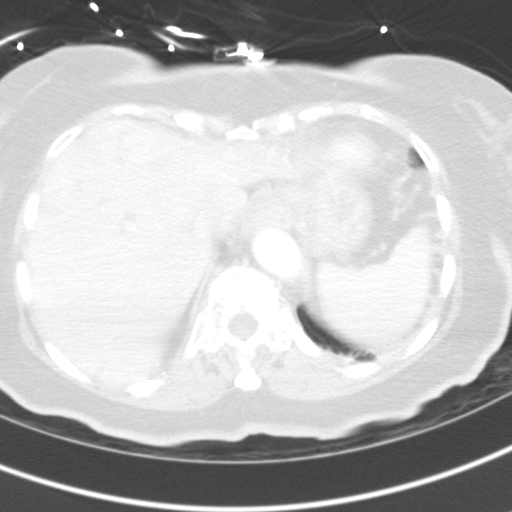
[im 11/46  lung]
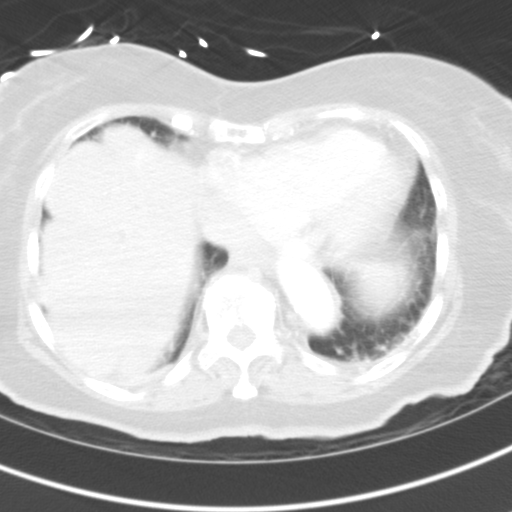
[im 14/46  lung]
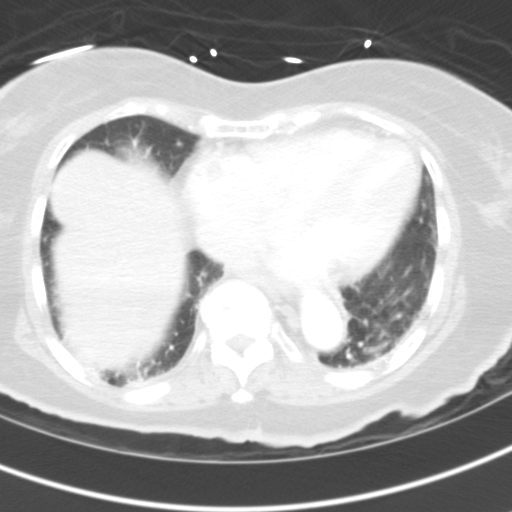
[im 17/46  mediastinal]
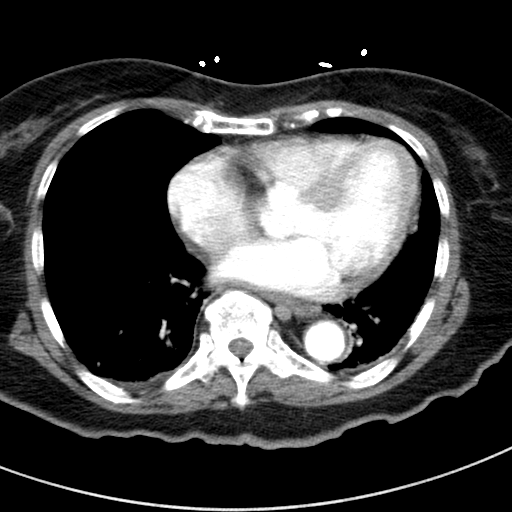
[im 17/46  lung]
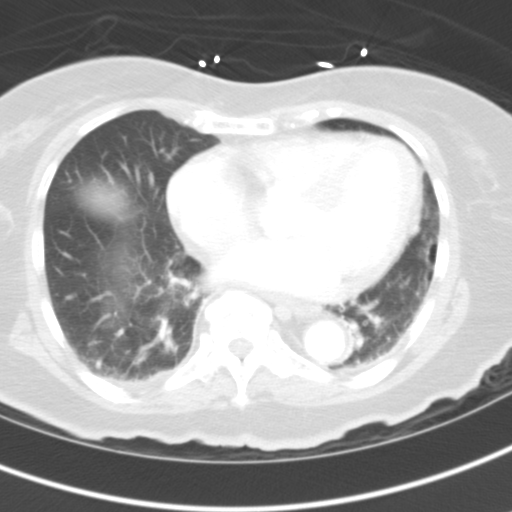
[im 21/46  lung]
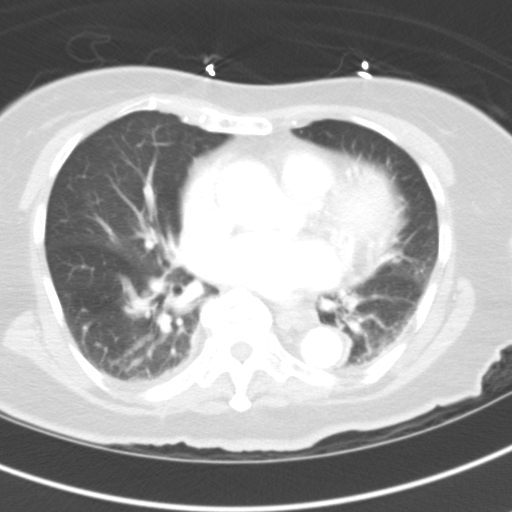
[im 26/46  lung]
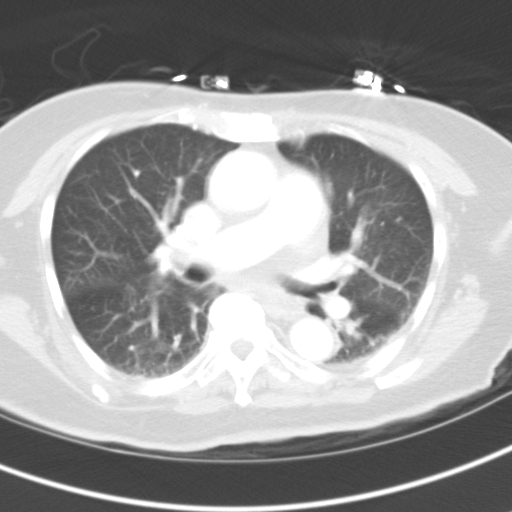
[im 29/46  lung]
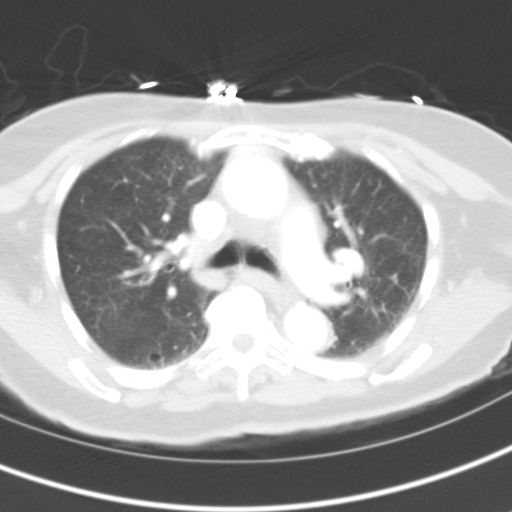
[im 32/46  mediastinal]
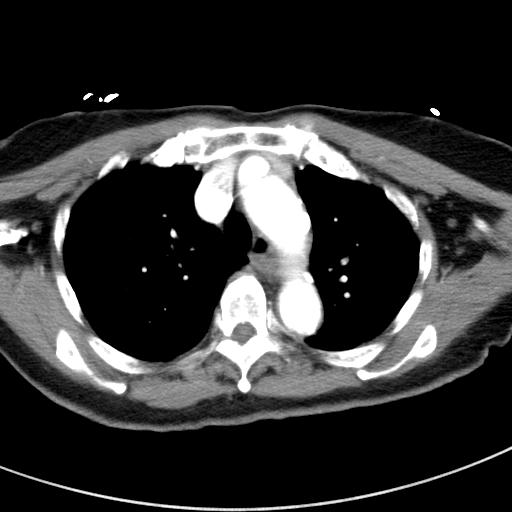
[im 32/46  lung]
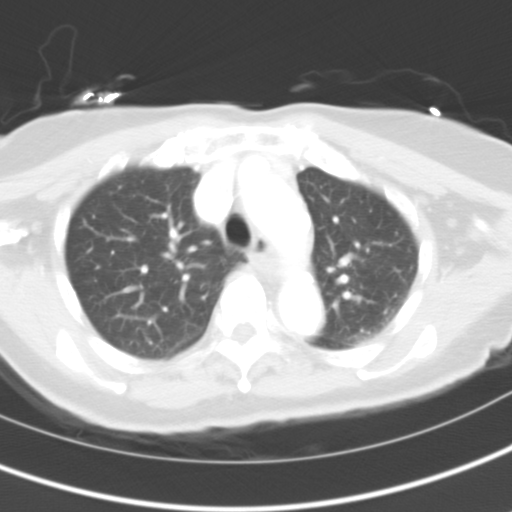
[im 36/46  lung]
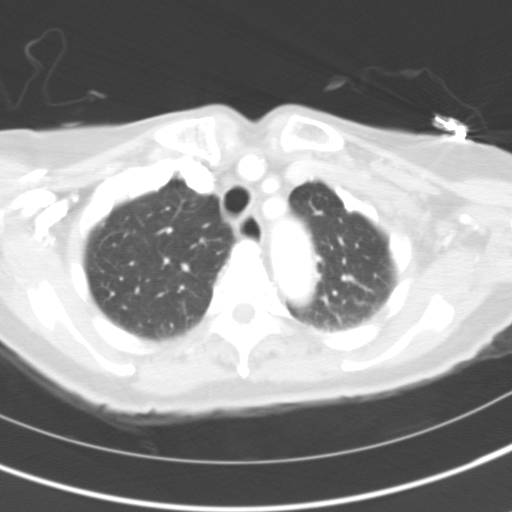
[im 39/46  lung]
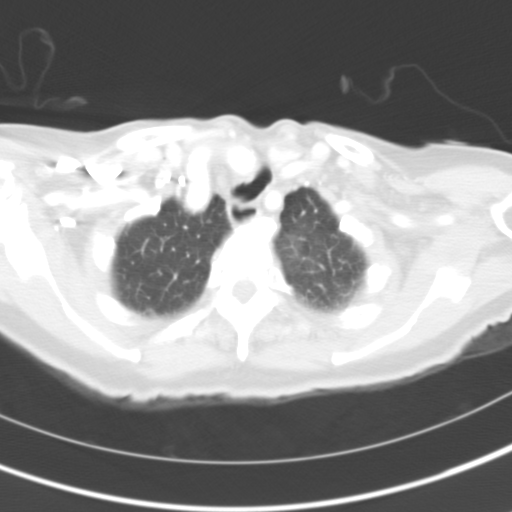
[im 42/46  lung]
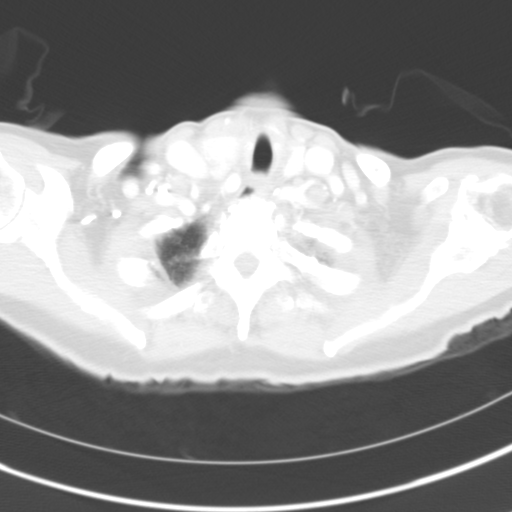

[Series 3: coronals · coronal · 0.48mm/px · 3 of 65 slices shown]
[im 13/65  lung]
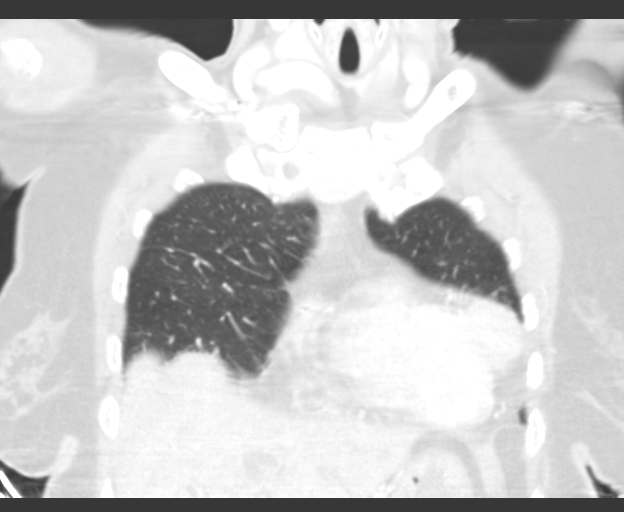
[im 26/65  lung]
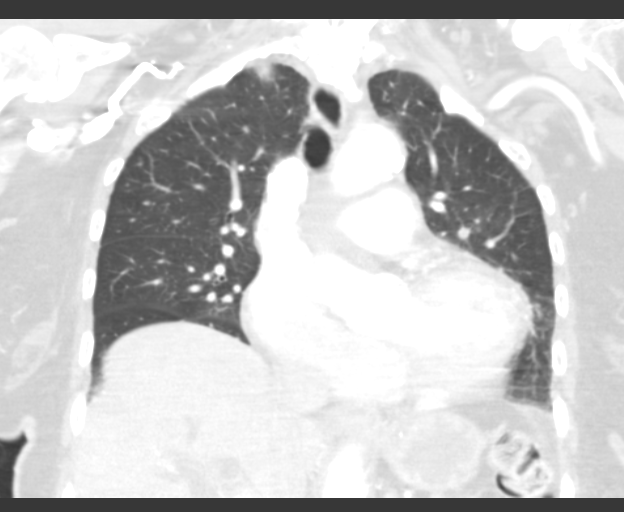
[im 39/65  lung]
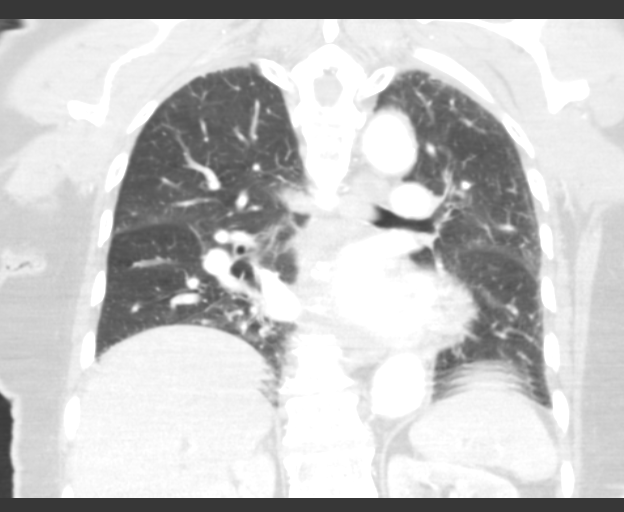

[15 of 36 positions shown; findings below may reference images not displayed]

FINDINGS: THORACIC INLET/BODY WALL:

No acute abnormality.

MEDIASTINUM:

Cardiomegaly without pericardial effusion. Atherosclerosis. No acute
vascular finding. No lymphadenopathy.

Gas in tortuous esophagus correlates with the upper mediastinal
lucency on previous chest x-ray.

LUNG WINDOWS:

No consolidation.  No effusion.  No suspicious pulmonary nodule.

UPPER ABDOMEN:

Intrahepatic bile duct dilatation is progressed since April 16, 2015. The hilum is not visualized. Patient has pancreatic cancer but
the mass was previously seen at the pancreatic body level.

OSSEOUS:

No acute fracture.  No suspicious lytic or blastic lesions.
IMPRESSION: 1. No intrathoracic finding to explain sepsis.
2. Progressive intrahepatic biliary dilatation/obstruction, partly
visualized. Could there be cholangitis?
